# Patient Record
Sex: Male | Born: 1980 | Race: Black or African American | Hispanic: No | Marital: Married | Smoking: Never smoker
Health system: Southern US, Community
[De-identification: ages and names within clinical notes are randomized; demographics above are authoritative.]

## PROBLEM LIST (undated history)

## (undated) DIAGNOSIS — F4310 Post-traumatic stress disorder, unspecified: Secondary | ICD-10-CM

## (undated) DIAGNOSIS — E669 Obesity, unspecified: Secondary | ICD-10-CM

## (undated) DIAGNOSIS — Q613 Polycystic kidney, unspecified: Secondary | ICD-10-CM

## (undated) DIAGNOSIS — M109 Gout, unspecified: Secondary | ICD-10-CM

## (undated) DIAGNOSIS — I10 Essential (primary) hypertension: Secondary | ICD-10-CM

## (undated) HISTORY — PX: NO PAST SURGERIES: SHX2092

## (undated) HISTORY — PX: AV FISTULA PLACEMENT: SHX1204

## (undated) HISTORY — PX: KNEE SURGERY: SHX244

---

## 2004-08-14 ENCOUNTER — Emergency Department (HOSPITAL_COMMUNITY): Admission: EM | Admit: 2004-08-14 | Discharge: 2004-08-14 | Payer: Self-pay | Admitting: Emergency Medicine

## 2005-02-24 ENCOUNTER — Emergency Department (HOSPITAL_COMMUNITY): Admission: EM | Admit: 2005-02-24 | Discharge: 2005-02-24 | Payer: Self-pay | Admitting: Emergency Medicine

## 2010-07-12 ENCOUNTER — Emergency Department: Payer: Self-pay | Admitting: Emergency Medicine

## 2012-09-24 ENCOUNTER — Emergency Department: Payer: Self-pay | Admitting: Emergency Medicine

## 2013-12-03 ENCOUNTER — Emergency Department: Payer: Self-pay | Admitting: Emergency Medicine

## 2014-06-21 ENCOUNTER — Emergency Department: Payer: Self-pay | Admitting: Emergency Medicine

## 2017-01-23 ENCOUNTER — Encounter: Payer: Self-pay | Admitting: *Deleted

## 2017-01-23 ENCOUNTER — Emergency Department: Payer: Non-veteran care

## 2017-01-23 ENCOUNTER — Inpatient Hospital Stay
Admission: EM | Admit: 2017-01-23 | Discharge: 2017-01-24 | DRG: 305 | Disposition: A | Discharge status: Home / Self Care | Payer: Non-veteran care | Attending: Internal Medicine | Admitting: Internal Medicine

## 2017-01-23 DIAGNOSIS — N189 Chronic kidney disease, unspecified: Secondary | ICD-10-CM | POA: Diagnosis present

## 2017-01-23 DIAGNOSIS — M109 Gout, unspecified: Secondary | ICD-10-CM | POA: Diagnosis present

## 2017-01-23 DIAGNOSIS — I129 Hypertensive chronic kidney disease with stage 1 through stage 4 chronic kidney disease, or unspecified chronic kidney disease: Secondary | ICD-10-CM | POA: Diagnosis present

## 2017-01-23 DIAGNOSIS — R0600 Dyspnea, unspecified: Secondary | ICD-10-CM

## 2017-01-23 DIAGNOSIS — E1122 Type 2 diabetes mellitus with diabetic chronic kidney disease: Secondary | ICD-10-CM | POA: Diagnosis present

## 2017-01-23 DIAGNOSIS — R042 Hemoptysis: Secondary | ICD-10-CM

## 2017-01-23 DIAGNOSIS — N179 Acute kidney failure, unspecified: Secondary | ICD-10-CM | POA: Diagnosis present

## 2017-01-23 DIAGNOSIS — R0602 Shortness of breath: Secondary | ICD-10-CM | POA: Diagnosis present

## 2017-01-23 DIAGNOSIS — I10 Essential (primary) hypertension: Secondary | ICD-10-CM | POA: Diagnosis present

## 2017-01-23 DIAGNOSIS — Z6841 Body Mass Index (BMI) 40.0 and over, adult: Secondary | ICD-10-CM

## 2017-01-23 DIAGNOSIS — I160 Hypertensive urgency: Secondary | ICD-10-CM | POA: Diagnosis not present

## 2017-01-23 DIAGNOSIS — Z79899 Other long term (current) drug therapy: Secondary | ICD-10-CM

## 2017-01-23 HISTORY — DX: Essential (primary) hypertension: I10

## 2017-01-23 HISTORY — DX: Gout, unspecified: M10.9

## 2017-01-23 HISTORY — DX: Obesity, unspecified: E66.9

## 2017-01-23 LAB — BASIC METABOLIC PANEL
Anion gap: 7 (ref 5–15)
BUN: 22 mg/dL — AB (ref 6–20)
CALCIUM: 9.4 mg/dL (ref 8.9–10.3)
CHLORIDE: 108 mmol/L (ref 101–111)
CO2: 26 mmol/L (ref 22–32)
CREATININE: 2.2 mg/dL — AB (ref 0.61–1.24)
GFR calc Af Amer: 43 mL/min — ABNORMAL LOW (ref >60)
GFR calc non Af Amer: 37 mL/min — ABNORMAL LOW (ref >60)
GLUCOSE: 100 mg/dL — AB (ref 65–99)
Potassium: 4.2 mmol/L (ref 3.5–5.1)
Sodium: 141 mmol/L (ref 135–145)

## 2017-01-23 LAB — CBC
HCT: 40.6 % (ref 40.0–52.0)
Hemoglobin: 13.6 g/dL (ref 13.0–18.0)
MCH: 27.6 pg (ref 26.0–34.0)
MCHC: 33.5 g/dL (ref 32.0–36.0)
MCV: 82.4 fL (ref 80.0–100.0)
PLATELETS: 191 10*3/uL (ref 150–440)
RBC: 4.93 MIL/uL (ref 4.40–5.90)
RDW: 13.7 % (ref 11.5–14.5)
WBC: 6.7 10*3/uL (ref 3.8–10.6)

## 2017-01-23 LAB — TROPONIN I

## 2017-01-23 LAB — BRAIN NATRIURETIC PEPTIDE: B Natriuretic Peptide: 20 pg/mL (ref 0.0–100.0)

## 2017-01-23 LAB — FIBRIN DERIVATIVES D-DIMER (ARMC ONLY): Fibrin derivatives D-dimer (ARMC): 329.25 (ref 0.00–499.00)

## 2017-01-23 MED ORDER — HYDRALAZINE HCL 20 MG/ML IJ SOLN
INTRAMUSCULAR | Status: AC
  Administered 2017-01-23: 10 mg via INTRAVENOUS
  Filled 2017-01-23: qty 1

## 2017-01-23 MED ORDER — HYDRALAZINE HCL 20 MG/ML IJ SOLN
10.0000 mg | INTRAMUSCULAR | Status: DC | PRN
Start: 2017-01-23 — End: 2017-01-24
  Administered 2017-01-23: 10 mg via INTRAVENOUS

## 2017-01-23 MED ORDER — HYDRALAZINE HCL 50 MG PO TABS
25.0000 mg | ORAL_TABLET | ORAL | Status: AC
  Administered 2017-01-23: 25 mg via ORAL
  Filled 2017-01-23: qty 1

## 2017-01-23 MED ORDER — FUROSEMIDE 40 MG PO TABS
40.0000 mg | ORAL_TABLET | ORAL | Status: AC
  Administered 2017-01-23: 40 mg via ORAL
  Filled 2017-01-23: qty 1

## 2017-01-23 MED ORDER — FUROSEMIDE 40 MG PO TABS: 40.0000 mg | ORAL_TABLET | Freq: Once | ORAL | Status: DC

## 2017-01-23 MED ORDER — HYDRALAZINE HCL 20 MG/ML IJ SOLN: 10.0000 mg | INTRAMUSCULAR | Status: DC | PRN

## 2017-01-23 MED ORDER — AMLODIPINE BESYLATE 10 MG PO TABS
10.0000 mg | ORAL_TABLET | Freq: Every day | ORAL | Status: DC
  Administered 2017-01-23 – 2017-01-24 (×2): 10 mg via ORAL
  Filled 2017-01-23: qty 1
  Filled 2017-01-23: qty 2

## 2017-01-23 NOTE — ED Notes (Signed)
[facility] [place]

## 2017-01-23 NOTE — ED Notes (Signed)
[facility] called to say that they only had one medical bed left and would not be able to accept the patient at this time. The earliest estimate would be Thursday before a bed opened up. MD advised of same.

## 2017-01-23 NOTE — ED Notes (Signed)
Patient denies pain and is resting comfortably.  

## 2017-01-23 NOTE — ED Triage Notes (Signed)
Pt reports a cough and sob. states coughing up blood tinged sputum today.  No chest pain  Pt alert.

## 2017-01-23 NOTE — ED Notes (Signed)
Patient ambulatory to the bathroom. Family remains at bedside. Tolerating po blood pressure medicine. Denies need for anything at this time. Will continue to monitor.

## 2017-01-23 NOTE — Progress Notes (Deleted)
Family Meeting Note  Advance Directiveyes  Today a meeting took place with the yes   The following clinical team members were present during this meeting:pt and dter The following were discussed:Patient's diagnosis: , Patient's progosis: Patient is being admitted for evaluation of microcytic anemia with heme-positive stools or in the lumen was 6.6 she is going to be transfused 1 unit of blood transfusion.  She is not actively bleeding at present.  Hemodynamically otherwise stable at present.   CODE STATUS addressed. Patient is a full code.  Time spent during discussion Hilmar-Irwin, MD

## 2017-01-23 NOTE — ED Notes (Signed)
Meal tray given. Patient's family at bedside. Dr Jannifer Franklin in to evaluate patient for admission.

## 2017-01-23 NOTE — H&P (Signed)
[place] [facility] [place] NAME: Marc Neal    MR#:  [id number]  DATE OF BIRTH:  29-Aug-1980  DATE OF ADMISSION:  01/23/2017  PRIMARY CARE PHYSICIAN: [facility]   REQUESTING/REFERRING PHYSICIAN: Jacqualine Code, MD  CHIEF COMPLAINT:   Chief Complaint  Patient presents with  . Cough  . Shortness of Breath    HISTORY OF PRESENT ILLNESS:  Marc Neal  is a 36 y.o. male who presents with wheezing and shortness of breath. Patient states this occurred after he had engaged in some strenuous exercise. Patient has a history of hypertension, states that his normal blood pressure runs between 824-235 systolic, and 36-144 diastolic. Patient states he's been taking his lisinopril every day, but ran out of his amlodipine several days ago. This medication is has been ordered, but has not arrived yet. Tonight his blood pressure was 315 systolic over 400Q and 676P diastolic. His chest x-rays consistent with vascular congestion. He does have a history of some chronic kidney disease, but states that his creatinine is usually less than 2. It was 2.2 in the ED here today, indicating some acute on chronic renal failure. Hospitalists were called for admission.  PAST MEDICAL HISTORY:   Past Medical History:  Diagnosis Date  . Gout   . HTN (hypertension)   . Obesity     PAST SURGICAL HISTORY:   Past Surgical History:  Procedure Laterality Date  . NO PAST SURGERIES      SOCIAL HISTORY:   Social History  Substance Use Topics  . Smoking status: Never Smoker  . Smokeless tobacco: Never Used  . Alcohol use Yes    FAMILY HISTORY:   Family History  Problem Relation Age of Onset  . Obesity Other     DRUG ALLERGIES:  No Known Allergies  MEDICATIONS AT HOME:   Prior to Admission medications   Medication Sig Start Date End Date Taking? Authorizing Provider  allopurinol (ZYLOPRIM) 100 MG tablet Take 100 mg by mouth daily.   Yes [provider]  amLODipine (NORVASC) 5 MG tablet Take 5 mg by mouth daily.   Yes [provider]  cholecalciferol (VITAMIN D) 1000 units tablet Take 1,000 Units by mouth daily.   Yes [provider]  lisinopril (PRINIVIL,ZESTRIL) 10 MG tablet Take 10 mg by mouth daily.   Yes [provider]    REVIEW OF SYSTEMS:  Review of Systems  Constitutional: Negative for chills, fever, malaise/fatigue and weight loss.  HENT: Negative for ear pain, hearing loss and tinnitus.   Eyes: Negative for blurred vision, double vision, pain and redness.  Respiratory: Positive for cough, shortness of breath and wheezing. Negative for hemoptysis.   Cardiovascular: Negative for chest pain, palpitations, orthopnea and leg swelling.  Gastrointestinal: Negative for abdominal pain, constipation, diarrhea, nausea and vomiting.  Genitourinary: Negative for dysuria, frequency and hematuria.  Musculoskeletal: Negative for back pain, joint pain and neck pain.  Skin:       No acne, rash, or lesions  Neurological: Negative for dizziness, tremors, focal weakness and weakness.  Endo/Heme/Allergies: Negative for polydipsia. Does not bruise/bleed easily.  Psychiatric/Behavioral: Negative for depression. The patient is not nervous/anxious and does not have insomnia.      VITAL SIGNS:   Vitals:   01/23/17 1708 01/23/17 1712 01/23/17 1900  BP:  (!) 158/120 (!) 163/134  Pulse:  100 72  Resp:  (!) 24 (!) 26  Temp:  99.2 F (37.3 C)   TempSrc:  Oral   SpO2:  91% 96%  Weight: (!) 154.2 kg (340 lb)    Height: 6\' 3"  (1.905 m)     Wt Readings from Last 3 Encounters:  01/23/17 (!) 154.2 kg (340 lb)    PHYSICAL EXAMINATION:  Physical Exam  Vitals reviewed. Constitutional: He is oriented to person, place, and time. He appears well-developed and well-nourished. No distress.  HENT:  Head: Normocephalic and atraumatic.  Mouth/Throat: Oropharynx is clear and moist.  Eyes: Conjunctivae and EOM are  normal. Pupils are equal, round, and reactive to light. No scleral icterus.  Neck: Normal range of motion. Neck supple. No JVD present. No thyromegaly present.  Cardiovascular: Normal rate, regular rhythm and intact distal pulses.  Exam reveals no gallop and no friction rub.   No murmur heard. Respiratory: Effort normal and breath sounds normal. No respiratory distress. He has no wheezes. He has no rales.  GI: Soft. Bowel sounds are normal. He exhibits no distension. There is no tenderness.  Musculoskeletal: Normal range of motion. He exhibits no edema.  No arthritis, no gout  Lymphadenopathy:    He has no cervical adenopathy.  Neurological: He is alert and oriented to person, place, and time. No cranial nerve deficit.  No dysarthria, no aphasia  Skin: Skin is warm and dry. No rash noted. No erythema.  Psychiatric: He has a normal mood and affect. His behavior is normal. Judgment and thought content normal.    LABORATORY PANEL:   CBC  Recent Labs Lab 01/23/17 1715  WBC 6.7  HGB 13.6  HCT 40.6  PLT 191   ------------------------------------------------------------------------------------------------------------------  Chemistries   Recent Labs Lab 01/23/17 1715  NA 141  K 4.2  CL 108  CO2 26  GLUCOSE 100*  BUN 22*  CREATININE 2.20*  CALCIUM 9.4   ------------------------------------------------------------------------------------------------------------------  Cardiac Enzymes  Recent Labs Lab 01/23/17 1715  TROPONINI <0.03   ------------------------------------------------------------------------------------------------------------------  RADIOLOGY:  Dg Chest 2 View  Result Date: 01/23/2017 CLINICAL DATA:  Cough, shortness of Breath EXAM: CHEST  2 VIEW COMPARISON:  None. FINDINGS: Cardiomegaly. Mild interstitial prominence within the lungs. No effusions or acute bony abnormality. IMPRESSION: Mild interstitial prominence throughout the lungs which could reflect  atypical infection or edema. Electronically Signed   By: Rolm Baptise M.D.   On: 01/23/2017 17:37    EKG:   Orders placed or performed during the hospital encounter of 01/23/17  . ED EKG within 10 minutes  . ED EKG within 10 minutes    IMPRESSION AND PLAN:  Principal Problem:   Accelerated hypertension - patient was given Lasix in the ED, as well as when necessary hydralazine. Given his renal failure we'll not give him any further Lasix at this point. When necessary hydralazine and has not reduced his blood pressure very much. We will use IV hydralazine as needed upfront, we will also give him his home medication of amlodipine. Patient will likely need adjustment of his home medication doses for better blood pressure control. We will get an echocardiogram in the morning, and a cardiology consult. Active Problems:   AKI (acute kidney injury) ([place]) - patient's creatinine is mildly up from his baseline, but not drastically so. We will monitor for improvement after correction of his blood pressure. We will avoid IV fluids at this time given the vascular congestion he had on his chest x-ray which required a dose of Lasix.   SOB (shortness of breath) - due to his accelerated hypertension and likely what was the beginnings of some  flash pulmonary edema, though he never quite made to evaluate the edema phase, and just had vascular congestion on chest x-ray.   Gout - continue home meds  All the records are reviewed and case discussed with ED provider. Management plans discussed with the patient and/or family.  DVT PROPHYLAXIS: SubQ heparin  GI PROPHYLAXIS: None  ADMISSION STATUS: Inpatient  CODE STATUS: Full Code Status History    This patient does not have a recorded code status. Please follow your organizational policy for patients in this situation.      TOTAL TIME TAKING CARE OF THIS PATIENT: 45 minutes.   Taralynn Quiett Larimore 01/23/2017, 9:08 PM  [facility]  Office   [phone]  CC: Primary care physician; [facility]  Note:  This document was prepared using Dragon voice recognition software and may include unintentional dictation errors.

## 2017-01-23 NOTE — ED Notes (Signed)
Patient denies need for anything at this time. Wife at bedside given blanket.

## 2017-01-23 NOTE — ED Provider Notes (Signed)
[facility] Emergency Department Provider Note   ____________________________________________   First MD Initiated Contact with Patient 01/23/17 1740     (approximate)  I have reviewed the triage vital signs and the nursing notes.   HISTORY  Chief Complaint Cough and Shortness of Breath    HPI Marc Neal is a 36 y.o. male previous history of gout, hypertension and polycystic kidney disease  Patient reports yesterday he didn't feel well, slightly fatigued, today he is developed slowly worsening cough with a small amount of blood in the sputum at times, feeling short of breath. Mostly with exerting himself. Denies any chest pain at all.  No fever or chill he is aware of, was told he may have a "low-grade" temperature when he got triaged here.  No history of long trips or travel. No previous blood clots. Does not take any estrogen. He is a nonsmoker. He is not noticing leg swelling.   Past Medical History:  Diagnosis Date  . Gout   . HTN (hypertension)   . Obesity     Patient Active Problem List   Diagnosis Date Noted  . Accelerated hypertension 01/23/2017  . AKI (acute kidney injury) ([place]) 01/23/2017  . Gout 01/23/2017  . SOB (shortness of breath) 01/23/2017    Past Surgical History:  Procedure Laterality Date  . NO PAST SURGERIES      Prior to Admission medications   Medication Sig Start Date End Date Taking? Authorizing Provider  allopurinol (ZYLOPRIM) 100 MG tablet Take 100 mg by mouth daily.   Yes [provider]  amLODipine (NORVASC) 5 MG tablet Take 5 mg by mouth daily.   Yes [provider]  cholecalciferol (VITAMIN D) 1000 units tablet Take 1,000 Units by mouth daily.   Yes [provider]  lisinopril (PRINIVIL,ZESTRIL) 10 MG tablet Take 10 mg by mouth daily.   Yes [provider]  Takes a couple antihypertensives and allopurinol  Allergies Patient has no known allergies.  Family  History  Problem Relation Age of Onset  . Obesity Other     Social History Social History  Substance Use Topics  . Smoking status: Never Smoker  . Smokeless tobacco: Never Used  . Alcohol use Yes    Review of Systems Constitutional: No fever/chills Eyes: No visual changes. ENT: No sore throat. Cardiovascular: Denies chest pain. Respiratory: See history of present illness  Gastrointestinal: No abdominal pain.  No nausea, no vomiting.  No diarrhea.  No constipation. Genitourinary: Negative for dysuria. Musculoskeletal: Negative for back pain. Skin: Negative for rash. Neurological: Negative for headaches, focal weakness or numbness.    ____________________________________________   PHYSICAL EXAM:  VITAL SIGNS: ED Triage Vitals  Enc Vitals Group     BP 01/23/17 1712 (!) 158/120     Pulse Rate 01/23/17 1712 100     Resp 01/23/17 1712 (!) 24     Temp 01/23/17 1712 99.2 F (37.3 C)     Temp Source 01/23/17 1712 Oral     SpO2 01/23/17 1712 91 %     Weight 01/23/17 1708 (!) 340 lb (154.2 kg)     Height 01/23/17 1708 6\' 3"  (1.905 m)     Head Circumference --      Peak Flow --      Pain Score 01/23/17 1708 4     Pain Loc --      Pain Edu? --      Excl. in [place]? --     Constitutional: Alert  and oriented. Well appearing and in no acute distress. Eyes: Conjunctivae are normal. Head: Atraumatic. Nose: No congestion/rhinnorhea. Mouth/Throat: Mucous membranes are moist. Neck: No stridor.   Cardiovascular: Normal rate, regular rhythm. Grossly normal heart sounds.  Good peripheral circulation. Respiratory: Normal respiratory effort.  No retractions. Lungs CTAB In the upper lobes but demonstrates mild rails in the lower lobes bilaterally. Gastrointestinal: Soft and nontender. No distention. Musculoskeletal: No lower extremity tenderness nor edema. Neurologic:  Normal speech and language. No gross focal neurologic deficits are appreciated.  Skin:  Skin is warm, dry and intact.  No rash noted. Psychiatric: Mood and affect are normal. Speech and behavior are normal.  ____________________________________________   LABS (all labs ordered are listed, but only abnormal results are displayed)  Labs Reviewed  BASIC METABOLIC PANEL - Abnormal; Notable for the following:       Result Value   Glucose, Bld 100 (*)    BUN 22 (*)    Creatinine, Ser 2.20 (*)    GFR calc non Af Amer 37 (*)    GFR calc Af Amer 43 (*)    All other components within normal limits  CBC  TROPONIN I  BRAIN NATRIURETIC PEPTIDE  FIBRIN DERIVATIVES D-DIMER ([facility] ONLY)   ____________________________________________  EKG  Reviewed and interpreted by me at 1720 Ventricular rate 99 QRS 80 QTc 480 Normal sinus rhythm, prolonged QT, no evidence of acute ischemia ____________________________________________  RADIOLOGY  Dg Chest 2 View  Result Date: 01/23/2017 CLINICAL DATA:  Cough, shortness of Breath EXAM: CHEST  2 VIEW COMPARISON:  None. FINDINGS: Cardiomegaly. Mild interstitial prominence within the lungs. No effusions or acute bony abnormality. IMPRESSION: Mild interstitial prominence throughout the lungs which could reflect atypical infection or edema. Electronically Signed   By: Rolm Baptise M.D.   On: 01/23/2017 17:37    ____________________________________________   PROCEDURES  Procedure(s) performed: None  Procedures  Critical Care performed: No  ____________________________________________   INITIAL IMPRESSION / ASSESSMENT AND PLAN / ED COURSE  Pertinent labs & imaging results that were available during my care of the patient were reviewed by me and considered in my medical decision making (see chart for details).  Patient presents for slight fatigue now worsening shortness of breath with a small amount of hemoptysis noted with sputum.  He is minimally tachycardic, moderately hypertensive with significant diastolic hypertension. His symptoms sound first somewhat like  someone with bronchitis, but on further evaluation and reviewing his BUN and creatinine and history of polycystic kidneys along with his chest x-ray findings I question if this could represent early CHF which would be a new diagnosis for him, or possibly a hypertensive urgency.  He demonstrates bilateral lower lobe rales, his white count is normal, and he has no registered fever though he did have a 99.2 which would be very low-grade afebrile though. I am concerned that he may be developing CHF needs further workup including a probably a cardiac echocardiogram.  His oxygen saturation of 91% as slightly low, and he is slightly tachypnea.  [facility] with [facility] at 618PM, reviewed case and care.   Clinical Course as of Jan 23 2330  Tue Jan 23, 2017  1901 [facility] completed and faxed  [MQ]    Clinical Course User Index [MQ] Delman Kitten, MD   ----------------------------------------- 8:06 PM on 01/23/2017 -----------------------------------------  Patient diuresis and now after Lasix. Her his oxygen saturation improving slightly. The [facility] [place] [facility] has reported to [place] that they have  no bed availability for the patient until Thursday. Last, patient will be admitted to our facility based on no availability at the [facility].  Patient agreeable with plan. Anticipate admission, workup for renal insufficiency, obtain records from the [place] which have been requested, and likely need for an echo to further evaluate as the patient may have pulmonary edema causing his presentation today associated with his severe diastolic hypertension.  ____________________________________________   FINAL CLINICAL IMPRESSION(S) / ED DIAGNOSES  Final diagnoses:  Hypertensive urgency  Dyspnea, unspecified type  Hemoptysis      NEW MEDICATIONS STARTED DURING THIS VISIT:  New Prescriptions   No medications on file     Note:  This document was prepared using Dragon  voice recognition software and may include unintentional dictation errors.     Delman Kitten, MD 01/23/17 [phone]

## 2017-01-24 ENCOUNTER — Inpatient Hospital Stay
Admit: 2017-01-24 | Discharge: 2017-01-24 | Disposition: A | Discharge status: Home / Self Care | Payer: Non-veteran care | Attending: Internal Medicine | Admitting: Internal Medicine

## 2017-01-24 LAB — CBC
HEMATOCRIT: 40.6 % (ref 40.0–52.0)
HEMOGLOBIN: 13.6 g/dL (ref 13.0–18.0)
MCH: 28 pg (ref 26.0–34.0)
MCHC: 33.6 g/dL (ref 32.0–36.0)
MCV: 83.3 fL (ref 80.0–100.0)
Platelets: 163 10*3/uL (ref 150–440)
RBC: 4.87 MIL/uL (ref 4.40–5.90)
RDW: 13.5 % (ref 11.5–14.5)
WBC: 6 10*3/uL (ref 3.8–10.6)

## 2017-01-24 LAB — BASIC METABOLIC PANEL
ANION GAP: 6 (ref 5–15)
BUN: 21 mg/dL — ABNORMAL HIGH (ref 6–20)
CALCIUM: 9.3 mg/dL (ref 8.9–10.3)
CO2: 29 mmol/L (ref 22–32)
Chloride: 106 mmol/L (ref 101–111)
Creatinine, Ser: 1.9 mg/dL — ABNORMAL HIGH (ref 0.61–1.24)
GFR, EST AFRICAN AMERICAN: 51 mL/min — AB (ref >60)
GFR, EST NON AFRICAN AMERICAN: 44 mL/min — AB (ref >60)
GLUCOSE: 108 mg/dL — AB (ref 65–99)
POTASSIUM: 3.6 mmol/L (ref 3.5–5.1)
SODIUM: 141 mmol/L (ref 135–145)

## 2017-01-24 LAB — ECHOCARDIOGRAM COMPLETE
HEIGHTINCHES: 75 in
WEIGHTICAEL: 5391.99 [oz_av]

## 2017-01-24 LAB — HIV ANTIBODY (ROUTINE TESTING W REFLEX): HIV SCREEN 4TH GENERATION: NONREACTIVE

## 2017-01-24 MED ORDER — LABETALOL HCL 300 MG PO TABS: 300.0000 mg | ORAL_TABLET | Freq: Two times a day (BID) | ORAL | 0 refills | Status: DC

## 2017-01-24 MED ORDER — ACETAMINOPHEN 650 MG RE SUPP: 650.0000 mg | Freq: Four times a day (QID) | RECTAL | Status: DC | PRN

## 2017-01-24 MED ORDER — LISINOPRIL 20 MG PO TABS
40.0000 mg | ORAL_TABLET | Freq: Every day | ORAL | Status: DC
  Administered 2017-01-24: 40 mg via ORAL
  Filled 2017-01-24: qty 2

## 2017-01-24 MED ORDER — HEPARIN SODIUM (PORCINE) 5000 UNIT/ML IJ SOLN: 5000.0000 [IU] | Freq: Three times a day (TID) | INTRAMUSCULAR | Status: DC

## 2017-01-24 MED ORDER — ALLOPURINOL 100 MG PO TABS
100.0000 mg | ORAL_TABLET | Freq: Every day | ORAL | Status: DC
  Administered 2017-01-24: 100 mg via ORAL
  Filled 2017-01-24: qty 1

## 2017-01-24 MED ORDER — ONDANSETRON HCL 4 MG PO TABS: 4.0000 mg | ORAL_TABLET | Freq: Four times a day (QID) | ORAL | Status: DC | PRN

## 2017-01-24 MED ORDER — LISINOPRIL 10 MG PO TABS
10.0000 mg | ORAL_TABLET | Freq: Every day | ORAL | Status: DC
  Filled 2017-01-24: qty 1

## 2017-01-24 MED ORDER — LABETALOL HCL 100 MG PO TABS
300.0000 mg | ORAL_TABLET | Freq: Two times a day (BID) | ORAL | Status: DC
  Administered 2017-01-24: 300 mg via ORAL
  Filled 2017-01-24: qty 1
  Filled 2017-01-24: qty 3

## 2017-01-24 MED ORDER — LABETALOL HCL 5 MG/ML IV SOLN: 10.0000 mg | INTRAVENOUS | Status: DC | PRN

## 2017-01-24 MED ORDER — LISINOPRIL 40 MG PO TABS: 40.0000 mg | ORAL_TABLET | Freq: Every day | ORAL | 0 refills | Status: DC

## 2017-01-24 MED ORDER — ONDANSETRON HCL 4 MG/2ML IJ SOLN: 4.0000 mg | Freq: Four times a day (QID) | INTRAMUSCULAR | Status: DC | PRN

## 2017-01-24 MED ORDER — ACETAMINOPHEN 325 MG PO TABS
650.0000 mg | ORAL_TABLET | Freq: Four times a day (QID) | ORAL | Status: DC | PRN
  Administered 2017-01-24: 650 mg via ORAL
  Filled 2017-01-24: qty 2

## 2017-01-24 MED ORDER — AMLODIPINE BESYLATE 10 MG PO TABS: 10.0000 mg | ORAL_TABLET | Freq: Every day | ORAL | 0 refills | Status: DC

## 2017-01-24 NOTE — Discharge Instructions (Signed)
[place] at [place]:  Cardiac diet  DISCHARGE CONDITION:  Stable  ACTIVITY:  Activity as tolerated  OXYGEN:  Home Oxygen: No.   Oxygen Delivery: room air  DISCHARGE LOCATION:  home    ADDITIONAL DISCHARGE INSTRUCTION: take all your meds   If you experience worsening of your admission symptoms, develop shortness of breath, life threatening emergency, suicidal or homicidal thoughts you must seek medical attention immediately by calling 911 or calling your MD immediately  if symptoms less severe.  You Must read complete instructions/literature along with all the possible adverse reactions/side effects for all the Medicines you take and that have been prescribed to you. Take any new Medicines after you have completely understood and accpet all the possible adverse reactions/side effects.   Please note  You were cared for by a hospitalist during your hospital stay. If you have any questions about your discharge medications or the care you received while you were in the hospital after you are discharged, you can call the unit and asked to speak with the hospitalist on call if the hospitalist that took care of you is not available. Once you are discharged, your primary care physician will handle any further medical issues. Please note that NO REFILLS for any discharge medications will be authorized once you are discharged, as it is imperative that you return to your primary care physician (or establish a relationship with a primary care physician if you do not have one) for your aftercare needs so that they can reassess your need for medications and monitor your lab values.

## 2017-01-24 NOTE — Progress Notes (Signed)
Patient alert and oriented, vss, no complaints of pain.  Educated on new medications and following the DASH diet.  No questions at this time.  D/C telemetry and PIV.  Escorted out of hospital via wheelchair by volunteers.

## 2017-01-24 NOTE — Discharge Summary (Signed)
[place] at [facility], [place] y.o., DOB Feb 08, 1981, MRN [id number]. Admission date: 01/23/2017 Discharge Date 01/24/2017 Primary MD [facility] Admitting Physician Lance Coon, MD  Admission Diagnosis  Hypertensive urgency [I16.0] Hemoptysis [R04.2] Dyspnea, unspecified type [R06.00]  Discharge Diagnosis   Principal Problem:   Accelerated hypertension    AKI (acute kidney injury) ([place])   Gout   SOB (shortness of breath)  Morbid obesity  Chronic kidney disease stage unknown        Hospital Course patient is a 36 year old male with history of hypertension presented with wheezing and shortness of breath. Patient's blood pressure was very elevated in the emergency room. He was admitted for further evaluation. Patient also was noted to have elevated creatinine. Due to this she was admitted for further evaluation. Patient's blood pressure medications have been adjusted his blood pressures improved. He had a echo cardiac, the heart which showed LVH otherwise no significant abnormality. Patient's renal function is close to baseline. He'll need outpatient close monitoring of his blood pressure and renal function.            Consults  cardiology  Significant Tests:  See full reports for all details    Dg Chest 2 View  Result Date: 01/23/2017 CLINICAL DATA:  Cough, shortness of Breath EXAM: CHEST  2 VIEW COMPARISON:  None. FINDINGS: Cardiomegaly. Mild interstitial prominence within the lungs. No effusions or acute bony abnormality. IMPRESSION: Mild interstitial prominence throughout the lungs which could reflect atypical infection or edema. Electronically Signed   By: Rolm Baptise M.D.   On: 01/23/2017 17:37       Today   Subjective:   Marc Neal patient feels better denies any chest pain or shortness of breath  Objective:   Blood pressure (!) 170/105, pulse 80, temperature 98.2 F (36.8 C), temperature source Oral, resp.  rate 18, height 6\' 3"  (1.905 m), weight (!) 337 lb (152.9 kg), SpO2 92 %.  .  Intake/Output Summary (Last 24 hours) at 01/24/17 1319 Last data filed at 01/24/17 0845  Gross per 24 hour  Intake              240 ml  Output             2775 ml  Net            -2535 ml    Exam VITAL SIGNS: Blood pressure (!) 170/105, pulse 80, temperature 98.2 F (36.8 C), temperature source Oral, resp. rate 18, height 6\' 3"  (1.905 m), weight (!) 337 lb (152.9 kg), SpO2 92 %.  GENERAL:  36 y.o.-year-old patient lying in the bed with no acute distress.  EYES: Pupils equal, round, reactive to light and accommodation. No scleral icterus. Extraocular muscles intact.  HEENT: Head atraumatic, normocephalic. Oropharynx and nasopharynx clear.  NECK:  Supple, no jugular venous distention. No thyroid enlargement, no tenderness.  LUNGS: Normal breath sounds bilaterally, no wheezing, rales,rhonchi or crepitation. No use of accessory muscles of respiration.  CARDIOVASCULAR: S1, S2 normal. No murmurs, rubs, or gallops.  ABDOMEN: Soft, nontender, nondistended. Bowel sounds present. No organomegaly or mass.  EXTREMITIES: No pedal edema, cyanosis, or clubbing.  NEUROLOGIC: Cranial nerves II through XII are intact. Muscle strength 5/5 in all extremities. Sensation intact. Gait not checked.  PSYCHIATRIC: The patient is alert and oriented x 3.  SKIN: No obvious rash, lesion, or ulcer.   Data Review     CBC w Diff: Lab Results  Component Value  Date   WBC 6.0 01/24/2017   HGB 13.6 01/24/2017   HCT 40.6 01/24/2017   PLT 163 01/24/2017   CMP: Lab Results  Component Value Date   NA 141 01/24/2017   K 3.6 01/24/2017   CL 106 01/24/2017   CO2 29 01/24/2017   BUN 21 (H) 01/24/2017   CREATININE 1.90 (H) 01/24/2017  .  Micro Results No results found for this or any previous visit (from the past 240 hour(s)).      Code Status Orders        Start     Ordered   01/24/17 0034  Full code  Continuous     01/24/17  0033    Code Status History    Date Active Date Inactive Code Status Order ID Comments User Context   This patient has a current code status but no historical code status.          Follow-up Information    [facility] Follow up in 1 week(s).   Contact information: [place] [address] [phone]           Discharge Medications   Allergies as of 01/24/2017   No Known Allergies     Medication List    TAKE these medications   allopurinol 100 MG tablet Commonly known as:  ZYLOPRIM Take 100 mg by mouth daily.   amLODipine 10 MG tablet Commonly known as:  NORVASC Take 1 tablet (10 mg total) by mouth daily. Start taking on:  01/25/2017 What changed:  medication strength  how much to take   cholecalciferol 1000 units tablet Commonly known as:  VITAMIN D Take 1,000 Units by mouth daily.   labetalol 300 MG tablet Commonly known as:  NORMODYNE Take 1 tablet (300 mg total) by mouth 2 (two) times daily.   lisinopril 40 MG tablet Commonly known as:  PRINIVIL,ZESTRIL Take 1 tablet (40 mg total) by mouth daily. Start taking on:  01/25/2017 What changed:  medication strength  how much to take          Total Time in preparing paper work, data evaluation and todays exam - 35 minutes  Dustin Flock M.D on 01/24/2017 at 1:19 PM  [facility]   Office  [phone]

## 2017-01-24 NOTE — Care Management (Signed)
Patient wishes to remain at [facility] for treatment. He states he feels ready to return home. He states that he is independent with daily activity. Lives with wife that is at bedside. PCP is with [facility] [phone] or [phone] (fax not on file). Refusal to transfer to [facility] faxed to [facility]- both facilities are closed for the holiday.

## 2017-01-24 NOTE — Progress Notes (Signed)
   01/24/17 1408  Clinical Encounter Type  Visited With Other (Comment)  Visit Type Initial   CH was rounding and approached patients room. No contact with patient as patient was with staff.

## 2017-01-24 NOTE — Progress Notes (Signed)
BP elevated, MD aware. Adjusting medications.  Continue to monitor.

## 2017-01-24 NOTE — Progress Notes (Signed)
BP under better control.  Patient ambulated around nursing station.  No complaints of shortness of breath or pain.  BP after exertion slightly higher.

## 2017-01-24 NOTE — Care Management (Signed)
Discharge summary faxed to [facility]. No other RNCM needs.

## 2017-01-24 NOTE — Consult Note (Signed)
[facility]  CARDIOLOGY CONSULT NOTE  Patient ID: Marc Neal MRN: [id number] DOB/AGE: [birth year] 36 y.o.  Admit date: 01/23/2017 Referring Physician Jannifer Franklin Primary Physician [facility] Primary Cardiologist  Reason for Consultation Hypertensive emergency  HPI: 36 year old gentleman referred for evaluation of hypertensive emergency. The patient has known history of essential hypertension, currently on lisinopril and amlodipine. The patient apparently ran out of amlodipine one to 2 weeks ago. He reports experiencing shortness of breath with wheezing. He presents to [facility] emergency room with hypertensive emergency with a pressure 158/120. Patient treated with intravenous labetalol. Admission ECG was nondiagnostic. Admission labs were notable for BUN and creatinine of 21 and 1.90, with troponin less than 0.03.  Review of systems complete and found to be negative unless listed above     Past Medical History:  Diagnosis Date  . Gout   . HTN (hypertension)   . Obesity     Past Surgical History:  Procedure Laterality Date  . NO PAST SURGERIES      Prescriptions Prior to Admission  Medication Sig Dispense Refill Last Dose  . allopurinol (ZYLOPRIM) 100 MG tablet Take 100 mg by mouth daily.   01/23/2017 at am  . amLODipine (NORVASC) 5 MG tablet Take 5 mg by mouth daily.   01/22/2017 at am  . cholecalciferol (VITAMIN D) 1000 units tablet Take 1,000 Units by mouth daily.   01/22/2017 at am  . lisinopril (PRINIVIL,ZESTRIL) 10 MG tablet Take 10 mg by mouth daily.   01/23/2017 at am   Social History   Social History  . Marital status: Married    Spouse name: N/A  . Number of children: N/A  . Years of education: N/A   Occupational History  . Not on file.   Social History Main Topics  . Smoking status: Never Smoker  . Smokeless tobacco: Never Used  . Alcohol use Yes  . Drug use: No  . Sexual activity: Not on file   Other Topics Concern  . Not on file   Social History Narrative  .  No narrative on file    Family History  Problem Relation Age of Onset  . Obesity Other       Review of systems complete and found to be negative unless listed above      PHYSICAL EXAM  General: Well developed, well nourished, in no acute distress HEENT:  Normocephalic and atramatic Neck:  No JVD.  Lungs: Clear bilaterally to auscultation and percussion. Heart: HRRR . Normal S1 and S2 without gallops or murmurs.  Abdomen: Bowel sounds are positive, abdomen soft and non-tender  Msk:  Back normal, normal gait. Normal strength and tone for age. Extremities: No clubbing, cyanosis or edema.   Neuro: Alert and oriented X 3. Psych:  Good affect, responds appropriately  Labs:   Lab Results  Component Value Date   WBC 6.0 01/24/2017   HGB 13.6 01/24/2017   HCT 40.6 01/24/2017   MCV 83.3 01/24/2017   PLT 163 01/24/2017    Recent Labs Lab 01/24/17 0448  NA 141  K 3.6  CL 106  CO2 29  BUN 21*  CREATININE 1.90*  CALCIUM 9.3  GLUCOSE 108*   Lab Results  Component Value Date   TROPONINI <0.03 01/23/2017   No results found for: CHOL No results found for: HDL No results found for: LDLCALC No results found for: TRIG No results found for: CHOLHDL No results found for: LDLDIRECT    Radiology: Dg Chest 2 View  Result Date:  01/23/2017 CLINICAL DATA:  Cough, shortness of Breath EXAM: CHEST  2 VIEW COMPARISON:  None. FINDINGS: Cardiomegaly. Mild interstitial prominence within the lungs. No effusions or acute bony abnormality. IMPRESSION: Mild interstitial prominence throughout the lungs which could reflect atypical infection or edema. Electronically Signed   By: Rolm Baptise M.D.   On: 01/23/2017 17:37    EKG: Normal sinus rhythm  ASSESSMENT AND PLAN:   1. Hypertensive emergency, likely due to medical and dietary noncompliance, patient clinically improved today, without chest pain, and negative troponin 2. Diabetic nephropathy  Recommendations  1. Agree with overall  current therapy 2. Start amlodipine 5-10 mg daily 3. Review 2-D echocardiogram 4. Strongly encouraged patient to exercise regularly 5. Strongly encouraged patient to lose weight 6. Stressed the importance of dietary and medical compliance  Sign off for now, please call if any questions  Signed: Isaias Cowman MD,PhD, [facility] 01/24/2017, 10:20 AM

## 2019-06-30 ENCOUNTER — Other Ambulatory Visit: Payer: Self-pay

## 2019-06-30 DIAGNOSIS — Z20822 Contact with and (suspected) exposure to covid-19: Secondary | ICD-10-CM

## 2019-07-01 LAB — NOVEL CORONAVIRUS, NAA: SARS-CoV-2, NAA: NOT DETECTED

## 2019-07-21 ENCOUNTER — Ambulatory Visit: Payer: No Typology Code available for payment source | Attending: Internal Medicine

## 2019-07-21 DIAGNOSIS — R238 Other skin changes: Secondary | ICD-10-CM

## 2019-07-21 DIAGNOSIS — U071 COVID-19: Secondary | ICD-10-CM

## 2019-07-23 LAB — NOVEL CORONAVIRUS, NAA: SARS-CoV-2, NAA: NOT DETECTED

## 2019-11-09 ENCOUNTER — Inpatient Hospital Stay
Admission: EM | Admit: 2019-11-09 | Discharge: 2019-11-11 | DRG: 392 | Disposition: A | Discharge status: Home / Self Care | Payer: No Typology Code available for payment source | Attending: Internal Medicine | Admitting: Internal Medicine

## 2019-11-09 ENCOUNTER — Emergency Department: Payer: No Typology Code available for payment source

## 2019-11-09 ENCOUNTER — Other Ambulatory Visit: Payer: Self-pay

## 2019-11-09 ENCOUNTER — Encounter: Payer: Self-pay | Admitting: Emergency Medicine

## 2019-11-09 ENCOUNTER — Emergency Department (HOSPITAL_COMMUNITY)
Admission: EM | Admit: 2019-11-09 | Discharge: 2019-11-09 | Disposition: A | Discharge status: Home / Self Care | Payer: No Typology Code available for payment source | Source: Home / Self Care | Attending: Emergency Medicine | Admitting: Emergency Medicine

## 2019-11-09 DIAGNOSIS — K529 Noninfective gastroenteritis and colitis, unspecified: Principal | ICD-10-CM | POA: Diagnosis present

## 2019-11-09 DIAGNOSIS — R109 Unspecified abdominal pain: Secondary | ICD-10-CM | POA: Diagnosis present

## 2019-11-09 DIAGNOSIS — R112 Nausea with vomiting, unspecified: Secondary | ICD-10-CM

## 2019-11-09 DIAGNOSIS — N183 Chronic kidney disease, stage 3 unspecified: Secondary | ICD-10-CM | POA: Diagnosis not present

## 2019-11-09 DIAGNOSIS — Z79899 Other long term (current) drug therapy: Secondary | ICD-10-CM | POA: Insufficient documentation

## 2019-11-09 DIAGNOSIS — N184 Chronic kidney disease, stage 4 (severe): Secondary | ICD-10-CM | POA: Diagnosis present

## 2019-11-09 DIAGNOSIS — I10 Essential (primary) hypertension: Secondary | ICD-10-CM | POA: Insufficient documentation

## 2019-11-09 DIAGNOSIS — I129 Hypertensive chronic kidney disease with stage 1 through stage 4 chronic kidney disease, or unspecified chronic kidney disease: Secondary | ICD-10-CM | POA: Diagnosis present

## 2019-11-09 DIAGNOSIS — Z20822 Contact with and (suspected) exposure to covid-19: Secondary | ICD-10-CM | POA: Diagnosis present

## 2019-11-09 DIAGNOSIS — M109 Gout, unspecified: Secondary | ICD-10-CM | POA: Diagnosis present

## 2019-11-09 DIAGNOSIS — Q613 Polycystic kidney, unspecified: Secondary | ICD-10-CM

## 2019-11-09 DIAGNOSIS — N179 Acute kidney failure, unspecified: Secondary | ICD-10-CM

## 2019-11-09 DIAGNOSIS — R1011 Right upper quadrant pain: Secondary | ICD-10-CM | POA: Diagnosis present

## 2019-11-09 DIAGNOSIS — R1031 Right lower quadrant pain: Secondary | ICD-10-CM | POA: Diagnosis not present

## 2019-11-09 LAB — URINALYSIS, COMPLETE (UACMP) WITH MICROSCOPIC
Bacteria, UA: NONE SEEN
Bilirubin Urine: NEGATIVE
Glucose, UA: NEGATIVE mg/dL
Ketones, ur: NEGATIVE mg/dL
Leukocytes,Ua: NEGATIVE
Nitrite: NEGATIVE
Protein, ur: 30 mg/dL — AB
Specific Gravity, Urine: 1.01 (ref 1.005–1.030)
Squamous Epithelial / LPF: NONE SEEN (ref 0–5)
pH: 6 (ref 5.0–8.0)

## 2019-11-09 LAB — LACTIC ACID, PLASMA: Lactic Acid, Venous: 0.9 mmol/L (ref 0.5–1.9)

## 2019-11-09 LAB — COMPREHENSIVE METABOLIC PANEL
ALT: 25 U/L (ref 0–44)
AST: 24 U/L (ref 15–41)
Albumin: 4.5 g/dL (ref 3.5–5.0)
Alkaline Phosphatase: 60 U/L (ref 38–126)
Anion gap: 10 (ref 5–15)
BUN: 37 mg/dL — ABNORMAL HIGH (ref 6–20)
CO2: 24 mmol/L (ref 22–32)
Calcium: 9.5 mg/dL (ref 8.9–10.3)
Chloride: 106 mmol/L (ref 98–111)
Creatinine, Ser: 3.63 mg/dL — ABNORMAL HIGH (ref 0.61–1.24)
GFR calc Af Amer: 23 mL/min — ABNORMAL LOW (ref >60)
GFR calc non Af Amer: 20 mL/min — ABNORMAL LOW (ref >60)
Glucose, Bld: 140 mg/dL — ABNORMAL HIGH (ref 70–99)
Potassium: 3.8 mmol/L (ref 3.5–5.1)
Sodium: 140 mmol/L (ref 135–145)
Total Bilirubin: 0.5 mg/dL (ref 0.3–1.2)
Total Protein: 8.2 g/dL — ABNORMAL HIGH (ref 6.5–8.1)

## 2019-11-09 LAB — CBC
HCT: 38.6 % — ABNORMAL LOW (ref 39.0–52.0)
Hemoglobin: 12.8 g/dL — ABNORMAL LOW (ref 13.0–17.0)
MCH: 27.6 pg (ref 26.0–34.0)
MCHC: 33.2 g/dL (ref 30.0–36.0)
MCV: 83.2 fL (ref 80.0–100.0)
Platelets: 214 10*3/uL (ref 150–400)
RBC: 4.64 MIL/uL (ref 4.22–5.81)
RDW: 12.9 % (ref 11.5–15.5)
WBC: 9 10*3/uL (ref 4.0–10.5)
nRBC: 0 % (ref 0.0–0.2)

## 2019-11-09 LAB — LIPASE, BLOOD: Lipase: 27 U/L (ref 11–51)

## 2019-11-09 MED ORDER — LABETALOL HCL 5 MG/ML IV SOLN: 10.0000 mg | INTRAVENOUS | Status: DC | PRN

## 2019-11-09 MED ORDER — OXYCODONE-ACETAMINOPHEN 5-325 MG PO TABS
1.0000 | ORAL_TABLET | Freq: Once | ORAL | Status: AC
  Administered 2019-11-09: 06:00:00 1 via ORAL
  Filled 2019-11-09: qty 1

## 2019-11-09 MED ORDER — AMLODIPINE BESYLATE 5 MG PO TABS
10.0000 mg | ORAL_TABLET | Freq: Once | ORAL | Status: AC
  Administered 2019-11-09: 10 mg via ORAL
  Filled 2019-11-09: qty 2

## 2019-11-09 MED ORDER — HYDROMORPHONE HCL 1 MG/ML IJ SOLN
1.0000 mg | INTRAMUSCULAR | Status: DC | PRN
  Administered 2019-11-10 (×2): 1 mg via INTRAVENOUS
  Filled 2019-11-09 (×2): qty 1

## 2019-11-09 MED ORDER — MORPHINE SULFATE (PF) 4 MG/ML IV SOLN
4.0000 mg | Freq: Once | INTRAVENOUS | Status: AC
  Administered 2019-11-09: 21:00:00 4 mg via INTRAVENOUS
  Filled 2019-11-09: qty 1

## 2019-11-09 MED ORDER — OXYCODONE-ACETAMINOPHEN 5-325 MG PO TABS
1.0000 | ORAL_TABLET | Freq: Once | ORAL | Status: AC
  Administered 2019-11-09: 14:00:00 1 via ORAL
  Filled 2019-11-09: qty 1

## 2019-11-09 MED ORDER — ONDANSETRON HCL 4 MG/2ML IJ SOLN
4.0000 mg | Freq: Once | INTRAMUSCULAR | Status: AC
  Administered 2019-11-09: 10:00:00 4 mg via INTRAVENOUS
  Filled 2019-11-09: qty 2

## 2019-11-09 MED ORDER — ENOXAPARIN SODIUM 40 MG/0.4ML ~~LOC~~ SOLN
40.0000 mg | Freq: Two times a day (BID) | SUBCUTANEOUS | Status: DC
  Administered 2019-11-10 – 2019-11-11 (×3): 40 mg via SUBCUTANEOUS
  Filled 2019-11-09 (×3): qty 0.4

## 2019-11-09 MED ORDER — ONDANSETRON HCL 4 MG/2ML IJ SOLN
4.0000 mg | Freq: Four times a day (QID) | INTRAMUSCULAR | Status: AC
  Administered 2019-11-10 (×4): 4 mg via INTRAVENOUS
  Filled 2019-11-09 (×4): qty 2

## 2019-11-09 MED ORDER — SODIUM CHLORIDE 0.9 % IV BOLUS
1000.0000 mL | Freq: Once | INTRAVENOUS | Status: AC
  Administered 2019-11-09: 1000 mL via INTRAVENOUS

## 2019-11-09 MED ORDER — LISINOPRIL 10 MG PO TABS
40.0000 mg | ORAL_TABLET | Freq: Once | ORAL | Status: AC
  Administered 2019-11-09: 40 mg via ORAL
  Filled 2019-11-09: qty 4

## 2019-11-09 MED ORDER — LISINOPRIL 20 MG PO TABS
40.0000 mg | ORAL_TABLET | Freq: Every day | ORAL | Status: DC
  Administered 2019-11-10: 40 mg via ORAL
  Filled 2019-11-09: qty 2

## 2019-11-09 MED ORDER — ALLOPURINOL 100 MG PO TABS
100.0000 mg | ORAL_TABLET | Freq: Every day | ORAL | Status: AC
  Administered 2019-11-10 – 2019-11-11 (×3): 100 mg via ORAL
  Filled 2019-11-09 (×3): qty 1

## 2019-11-09 MED ORDER — DEXTROSE-NACL 5-0.45 % IV SOLN: INTRAVENOUS | Status: AC

## 2019-11-09 MED ORDER — OXYCODONE-ACETAMINOPHEN 5-325 MG PO TABS: 1.0000 | ORAL_TABLET | ORAL | 0 refills | Status: AC | PRN

## 2019-11-09 MED ORDER — TRAZODONE HCL 50 MG PO TABS: 25.0000 mg | ORAL_TABLET | Freq: Every evening | ORAL | Status: DC | PRN

## 2019-11-09 MED ORDER — ONDANSETRON HCL 4 MG PO TABS: 4.0000 mg | ORAL_TABLET | Freq: Three times a day (TID) | ORAL | 0 refills | Status: DC | PRN

## 2019-11-09 MED ORDER — MORPHINE SULFATE (PF) 4 MG/ML IV SOLN
4.0000 mg | Freq: Once | INTRAVENOUS | Status: AC
  Administered 2019-11-09: 10:00:00 4 mg via INTRAVENOUS
  Filled 2019-11-09: qty 1

## 2019-11-09 MED ORDER — LABETALOL HCL 200 MG PO TABS
200.0000 mg | ORAL_TABLET | Freq: Once | ORAL | Status: AC
  Administered 2019-11-09: 22:00:00 200 mg via ORAL
  Filled 2019-11-09: qty 1

## 2019-11-09 MED ORDER — PROMETHAZINE HCL 25 MG/ML IJ SOLN
25.0000 mg | Freq: Once | INTRAMUSCULAR | Status: AC
  Administered 2019-11-09: 21:00:00 25 mg via INTRAVENOUS
  Filled 2019-11-09: qty 1

## 2019-11-09 MED ORDER — SODIUM CHLORIDE 0.9 % IV BOLUS
1000.0000 mL | Freq: Once | INTRAVENOUS | Status: AC
  Administered 2019-11-09: 12:00:00 1000 mL via INTRAVENOUS

## 2019-11-09 MED ORDER — LABETALOL HCL 100 MG PO TABS
100.0000 mg | ORAL_TABLET | Freq: Two times a day (BID) | ORAL | Status: DC
  Administered 2019-11-10 – 2019-11-11 (×4): 100 mg via ORAL
  Filled 2019-11-09 (×5): qty 1

## 2019-11-09 MED ORDER — AMLODIPINE BESYLATE 10 MG PO TABS
10.0000 mg | ORAL_TABLET | Freq: Every day | ORAL | Status: DC
  Administered 2019-11-10 – 2019-11-11 (×3): 10 mg via ORAL
  Filled 2019-11-09 (×3): qty 1

## 2019-11-09 NOTE — ED Notes (Signed)
Pt transported to CT ?

## 2019-11-09 NOTE — ED Triage Notes (Signed)
Patient reports having right upper quad pain with vomiting that started tonight.

## 2019-11-09 NOTE — Discharge Instructions (Addendum)
As we discussed it is extremely important [facility]  Please return or be seen immediately for any worsening pain, fevers, persistent vomiting, bloody vomiting or stool, concerns for dehydration or any other new or concerning symptoms.

## 2019-11-09 NOTE — ED Notes (Signed)
Pt c/o RUQ pain and also mild nausea. Dr. Archie Balboa, MD made aware.

## 2019-11-09 NOTE — H&P (Signed)
History and Physical    Marc Neal BTD:[id number] DOB: 1981-05-27 DOA: 11/09/2019  PCP: [facility]   Patient coming from: home  I have personally briefly reviewed patient's old medical records in [place]  Chief Complaint: intractable N/V and abdominal pain.  HPI: Marc Neal is a 39 y.o. male  returns to the emergency department for intractable nausea and vomiting, abdominal pain.  Marc Neal reports he began having N/V Saturday morning followed by generalized abdominal pain. Saturday night the pain got worse leading him to come to the ED this AM. He  does report that the pain became more localized to the RLQ over the course of the day. Patient was evaluated this morning, it was felt the patient had  severe enteritis.  Initially there was some concern for obstruction on CT scan but surgery, Dr. Hampton Abbot, evaluated the patient and did not believe that this was a surgical problem for an obstruction or volvulus at that time.  Patient was recommended to stay in the hospital but at that time wished to proceed home.  Patient states that he proceeded home, was using the prescribed pain medication and antiemetic without relief.  Patient states that the pain has been worsening, and the nausea is intractable even with Zofran.  He reports having an episode of hematemesis with stringy clot x 1 earlier today. Patient has not been able to keep any fluid, food down on return home.  Patient also states that he is unable to take his other regular medications to include blood pressure and allopurinol.  Patient returns requesting admission at this time as he does not feel he can manage his symptoms outpatient.  No new complaints of fevers or chills, URI symptoms.   Marc Neal reports that he had hematuria about a week ago along with scrotal swelling and some flank discomfort. He went to the [place] for evaluation and was told he may have passed a kidney stone.   ED Course: Patient had lab in  AM notable for glucose 140, lactic acid 0.9, noleukocytosis, Cr 3.6 (same as last lab at [facility] in [place]). CT in AM with small intestinal wall thickening c/w enteritis but also possible obstruction or volvulus. GS, Dr. Hampton Abbot, opined there was not a surigcal situation at that time. No new labs done. [facility] called to admit patient for pain control, control of N/V.  Review of Systems: As per HPI otherwise 10 point review of systems negative.    Past Medical History:  Diagnosis Date  . Gout   . HTN (hypertension)   . Obesity     Past Surgical History:  Procedure Laterality Date  . NO PAST SURGERIES     Soc Hx - HSG. 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 His last assignment was at the [employer]. He married 4 years ago. He lives with his wife and two step-children. 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    reports that he has never smoked. He has never used smokeless tobacco. He reports current alcohol use. He reports that he does not use drugs.  No Known Allergies  Family History  Problem Relation Age of Onset  . Obesity Other      Prior to Admission medications   Medication Sig Start Date End Date Taking? Authorizing Provider  allopurinol (ZYLOPRIM) 100 MG tablet Take 100 mg by mouth daily.    [provider]  amLODipine (NORVASC) 10 MG tablet Take  1 tablet (10 mg total) by mouth daily. 01/25/17   Dustin Flock, MD  cholecalciferol (VITAMIN D) 1000 units tablet Take 1,000 Units by mouth daily.    [provider]  labetalol (NORMODYNE) 300 MG tablet Take 1 tablet (300 mg total) by mouth 2 (two) times daily. 01/24/17   Dustin Flock, MD  lisinopril (PRINIVIL,ZESTRIL) 40 MG tablet Take 1 tablet (40 mg total) by mouth daily. 01/25/17   Dustin Flock, MD  ondansetron (ZOFRAN) 4 MG tablet Take 1 tablet (4 mg total) by mouth every 8 (eight) hours as needed. 11/09/19   Nance Pear, MD  oxyCODONE-acetaminophen (PERCOCET)  5-325 MG tablet Take 1 tablet by mouth every 4 (four) hours as needed for severe pain. 11/09/19 11/08/20  Nance Pear, MD    Physical Exam: Vitals:   11/09/19 1817 11/09/19 2119 11/09/19 2228 11/09/19 2231  BP:  (!) 187/135 (!) 166/121 (!) 166/121  Pulse:   99 99  Resp:    18  Temp:      TempSrc:      SpO2:    99%  Weight: (!) 149.7 kg     Height: 6\' 3"  (1.905 m)       Constitutional: NAD, calm, comfortable Vitals:   11/09/19 1817 11/09/19 2119 11/09/19 2228 11/09/19 2231  BP:  (!) 187/135 (!) 166/121 (!) 166/121  Pulse:   99 99  Resp:    18  Temp:      TempSrc:      SpO2:    99%  Weight: (!) 149.7 kg     Height: 6\' 3"  (1.905 m)      General -  Obese man in no acute distress. Eyes: PERRL, lids and conjunctivae normal ENMT: Mucous membranes are moist. Posterior pharynx clear of any exudate or lesions.Normal dentition.  Neck: normal, supple, no masses, no thyromegaly Respiratory: clear to auscultation bilaterally, no wheezing, no crackles. Normal respiratory effort. No accessory muscle use.  Cardiovascular: Regular rate and rhythm, no murmurs / rubs / gallops. No extremity edema. 2+ pedal pulses. No carotid bruits.  Abdomen: BS + x 4. Tender to palpation and percussion with point of maximal tenderness RLQ. Percussion in other quadrants refers pain to RLQ.  No hepatosplenomegaly but exam hindered by girth. No guarding or rebound tenderness.  Musculoskeletal: no clubbing / cyanosis. No joint deformity upper and lower extremities. Good ROM, no contractures. Normal muscle tone.  Skin: no rashes, lesions, ulcers. No induration Neurologic: CN 2-12 grossly intact. Sensation intact, DTR normal. Strength 5/5 in all 4.  Psychiatric: Normal judgment and insight. Alert and oriented x 3. Normal mood.     Labs on Admission: I have personally reviewed following labs and imaging studies  CBC: Recent Labs  Lab 11/09/19 0552  WBC 9.0  HGB 12.8*  HCT 38.6*  MCV 83.2  PLT 536    Basic Metabolic Panel: Recent Labs  Lab 11/09/19 0552  NA 140  K 3.8  CL 106  CO2 24  GLUCOSE 140*  BUN 37*  CREATININE 3.63*  CALCIUM 9.5   GFR: Estimated Creatinine Clearance: 43.2 mL/min (A) (by C-G formula based on SCr of 3.63 mg/dL (H)). Liver Function Tests: Recent Labs  Lab 11/09/19 0552  AST 24  ALT 25  ALKPHOS 60  BILITOT 0.5  PROT 8.2*  ALBUMIN 4.5   Recent Labs  Lab 11/09/19 0552  LIPASE 27   No results for input(s): AMMONIA in the last 168 hours. Coagulation Profile: No results for input(s): INR, PROTIME in the  last 168 hours. Cardiac Enzymes: No results for input(s): CKTOTAL, CKMB, CKMBINDEX, TROPONINI in the last 168 hours. BNP (last 3 results) No results for input(s): PROBNP in the last 8760 hours. HbA1C: No results for input(s): HGBA1C in the last 72 hours. CBG: No results for input(s): GLUCAP in the last 168 hours. Lipid Profile: No results for input(s): CHOL, HDL, LDLCALC, TRIG, CHOLHDL, LDLDIRECT in the last 72 hours. Thyroid Function Tests: No results for input(s): TSH, T4TOTAL, FREET4, T3FREE, THYROIDAB in the last 72 hours. Anemia Panel: No results for input(s): VITAMINB12, FOLATE, FERRITIN, TIBC, IRON, RETICCTPCT in the last 72 hours. Urine analysis:    Component Value Date/Time   COLORURINE STRAW (A) 11/09/2019 0552   APPEARANCEUR CLEAR (A) 11/09/2019 0552   LABSPEC 1.010 11/09/2019 0552   PHURINE 6.0 11/09/2019 0552   GLUCOSEU NEGATIVE 11/09/2019 0552   HGBUR SMALL (A) 11/09/2019 0552   BILIRUBINUR NEGATIVE 11/09/2019 [place] 11/09/2019 0552   PROTEINUR 30 (A) 11/09/2019 0552   NITRITE NEGATIVE 11/09/2019 0552   LEUKOCYTESUR NEGATIVE 11/09/2019 0552    Radiological Exams on Admission: CT ABDOMEN PELVIS WO CONTRAST  Result Date: 11/09/2019 CLINICAL DATA:  Acute right upper quadrant abdominal pain. EXAM: CT ABDOMEN AND PELVIS WITHOUT CONTRAST TECHNIQUE: Multidetector CT imaging of the abdomen and pelvis  was performed following the standard protocol without IV contrast. COMPARISON:  None. FINDINGS: Lower chest: No acute abnormality. Hepatobiliary: No gallstones or biliary dilatation is noted. Multiple hepatic cysts are noted. Pancreas: Unremarkable. No pancreatic ductal dilatation or surrounding inflammatory changes. Spleen: Normal in size without focal abnormality. Adrenals/Urinary Tract: Adrenal glands appear normal. Innumerable cysts are seen involving both kidneys consistent with polycystic kidney disease. No hydronephrosis or renal obstruction is noted. No renal or ureteral calculi are noted. Urinary bladder is unremarkable. Stomach/Bowel: The stomach and appendix are unremarkable. There is a loop of small bowel in the central abdomen anteriorly with severe wall thickening and surrounding inflammation consistent with severe enteritis or possibly closed loop obstruction such as volvulus. Vascular/Lymphatic: No significant vascular findings are present. No enlarged abdominal or pelvic lymph nodes. Reproductive: Prostate is unremarkable. Other: Small fat containing periumbilical hernia is noted. Free fluid is noted in the pelvis suggesting inflammation. Musculoskeletal: No acute or significant osseous findings. IMPRESSION: 1. There is a loop of small bowel in the central abdomen with severe wall thickening and surrounding inflammation consistent with severe enteritis or possibly closed loop obstruction such as volvulus. Mild amount of free fluid is noted in the pelvis. 2. Findings consistent with polycystic kidney disease, with multiple hepatic cysts present as well. Electronically Signed   By: Marijo Conception M.D.   On: 11/09/2019 09:34    EKG: Independently reviewed. NSR w/o abnormalities  Assessment/Plan Active Problems:   Abdominal pain   Enteritis   Accelerated hypertension   AKI (acute kidney injury) ([place])   Gout  (please populate well all problems here in Problem List. (For example, if patient  is on BP meds at home and you resume or decide to hold them, it is a problem that needs to be her. Same for CAD, COPD, HLD and so on)   1. GI - patient with abdominal pain, N/V. Exam does NOT suggest obstruction. CT reveals small bowel wall thickening c/w enteritis. Migration of pain to RLQ raises the question of smoldering appendicitis, possible retroflexed. However, no fever or leukocytosis thus no indication for Abx. Hematemesis c/w Mallory-Weis tear from refractory vomiting. Plan Med-surg admit  NPO except sip/meds/ice  chips  IV D5 1/2 NS for hydration  Protonix IV  Dilaudid for pain control  IV Zofran for nausea  F/u abdominal x-ray in AM  F/u CBCD in AM  2. HTN- patient with significant HTN. Plan Continue home meds  IV labetelol for inability to take PO antihypertensives or SBP > 170  3. CKD - patient with CKD in setting of polycystic kidney disease. He did have an elevated Cr to 3.6 last Friday, prior to onset of current illness Plan Hydration  OK to continue ACE-I  F/u Bmet in AM  4. Gout - no flare Plan Continue allopurinol      DVT prophylaxis: lovenox  Code Status: full code  Family Communication: wife present during exam. Answered all questions  Disposition Plan: home when medically stable  Consults called: GS Dr. Hampton Abbot, may need GI consult if symptoms don't improve Admission status: inpatient    Adella Hare MD [facility] Pager [phone]  If 7PM-7AM, please contact night-coverage [email or web address] Password [facility]  11/09/2019, 10:39 PM

## 2019-11-09 NOTE — ED Notes (Signed)
Pt ambulatory to the restroom at this time.  

## 2019-11-09 NOTE — Consult Note (Signed)
Date of Consultation:  11/09/2019  Requesting Physician:  Nance Pear, MD  Reason for Consultation:  Abdominal pain  History of Present Illness: Marc Neal is a 39 y.o. male presenting with a one day history of RUQ abdominal pain.  Patient reports that he had old [place] food on 4/15 and then last night around 6 pm started having RUQ abdominal pain.  Patient reports having nausea and two episodes of emesis.  But this did not improve the pain.  He also reports having loose bowel movement yesterday and then somewhat more formed this morning.  However, bowel movements did not improve the pain.  Denies any fevers, chills, chest pain, shortness of breath.  The pain has remained in RUQ with no radiation.    Patient presented to the ED today and his workup showed relatively normal labs with exception of elevated creatinine to 3.63.  He has history of PCKD and reports that recent labs from the [place] are similar.  His WBC is normal at 9.0.  Had a CT scan of abdomen and pelvis without contrast and this showed a segment of small bowel in the mid abdomen with significant wall thickening and associated mesenteric stranding.  Report raises concern for possible severe enteritis vs volvulus.  I have independently viewed the images.  In tracing the small bowel, I do see the segment that is very thickened, but I do not see a mesenteric swirl or bird's beak type of sign in the bowel to indicate twisting, or any significant change in bowel caliber.  Gallbladder is not distended and there is no wall thickening or cholelithiasis.  Past Medical History: Past Medical History:  Diagnosis Date  . Gout   . HTN (hypertension)   . Obesity      Past Surgical History: Past Surgical History:  Procedure Laterality Date  . NO PAST SURGERIES      Home Medications: Prior to Admission medications   Medication Sig Start Date End Date Taking? Authorizing Provider  allopurinol (ZYLOPRIM) 100 MG tablet Take 100 mg by  mouth daily.    [provider]  amLODipine (NORVASC) 10 MG tablet Take 1 tablet (10 mg total) by mouth daily. 01/25/17   Dustin Flock, MD  cholecalciferol (VITAMIN D) 1000 units tablet Take 1,000 Units by mouth daily.    [provider]  labetalol (NORMODYNE) 300 MG tablet Take 1 tablet (300 mg total) by mouth 2 (two) times daily. 01/24/17   Dustin Flock, MD  lisinopril (PRINIVIL,ZESTRIL) 40 MG tablet Take 1 tablet (40 mg total) by mouth daily. 01/25/17   Dustin Flock, MD  ondansetron (ZOFRAN) 4 MG tablet Take 1 tablet (4 mg total) by mouth every 8 (eight) hours as needed. 11/09/19   Nance Pear, MD  oxyCODONE-acetaminophen (PERCOCET) 5-325 MG tablet Take 1 tablet by mouth every 4 (four) hours as needed for severe pain. 11/09/19 11/08/20  Nance Pear, MD    Allergies: No Known Allergies  Social History:  reports that he has never smoked. He has never used smokeless tobacco. He reports current alcohol use. He reports that he does not use drugs.   Family History: Family History  Problem Relation Age of Onset  . Obesity Other     Review of Systems: Review of Systems  Constitutional: Negative for chills and fever.  HENT: Negative for hearing loss.   Respiratory: Negative for cough.   Cardiovascular: Negative for chest pain.  Gastrointestinal: Positive for abdominal pain, diarrhea, nausea and vomiting. Negative for blood in stool  and constipation.  Genitourinary: Negative for dysuria.  Musculoskeletal: Negative for myalgias.  Skin: Negative for rash.  Neurological: Negative for dizziness.  Psychiatric/Behavioral: Negative for depression.    Physical Exam BP (S) (!) 152/118 (BP Location: Right Arm) Comment: Pt has a hx of HTN and has not has his meds  Pulse 98   Temp 98.6 F (37 C) (Oral)   Resp 20   Ht 6\' 3"  (1.905 m)   Wt (!) 149.7 kg   SpO2 98%   BMI 41.25 kg/m  CONSTITUTIONAL: No acute distress HEENT:  Normocephalic, atraumatic, extraocular  motion intact. NECK: Trachea is midline, and there is no jugular venous distension. RESPIRATORY:  Lungs are clear, and breath sounds are equal bilaterally. Normal respiratory effort without pathologic use of accessory muscles. CARDIOVASCULAR: Heart is regular without murmurs, gallops, or rubs. GI: The abdomen is soft, obese, non-distended, with tenderness to palpation in the RUQ, but negative Murphy's sign.  No tenderness at the umbilical area where the thickened loop is located. There were no palpable masses.  MUSCULOSKELETAL:  Normal muscle strength and tone in all four extremities.  No peripheral edema or cyanosis. SKIN: Skin turgor is normal. There are no pathologic skin lesions.  NEUROLOGIC:  Motor and sensation is grossly normal.  Cranial nerves are grossly intact. PSYCH:  Alert and oriented to person, place and time. Affect is normal.  Laboratory Analysis: Results for orders placed or performed during the hospital encounter of 11/09/19 (from the past 24 hour(s))  Lipase, blood     Status: None   Collection Time: 11/09/19  5:52 AM  Result Value Ref Range   Lipase 27 11 - 51 U/L  Comprehensive metabolic panel     Status: Abnormal   Collection Time: 11/09/19  5:52 AM  Result Value Ref Range   Sodium 140 135 - 145 mmol/L   Potassium 3.8 3.5 - 5.1 mmol/L   Chloride 106 98 - 111 mmol/L   CO2 24 22 - 32 mmol/L   Glucose, Bld 140 (H) 70 - 99 mg/dL   BUN 37 (H) 6 - 20 mg/dL   Creatinine, Ser 3.63 (H) 0.61 - 1.24 mg/dL   Calcium 9.5 8.9 - 10.3 mg/dL   Total Protein 8.2 (H) 6.5 - 8.1 g/dL   Albumin 4.5 3.5 - 5.0 g/dL   AST 24 15 - 41 U/L   ALT 25 0 - 44 U/L   Alkaline Phosphatase 60 38 - 126 U/L   Total Bilirubin 0.5 0.3 - 1.2 mg/dL   GFR calc non Af Amer 20 (L) >60 mL/min   GFR calc Af Amer 23 (L) >60 mL/min   Anion gap 10 5 - 15  CBC     Status: Abnormal   Collection Time: 11/09/19  5:52 AM  Result Value Ref Range   WBC 9.0 4.0 - 10.5 K/uL   RBC 4.64 4.22 - 5.81 MIL/uL    Hemoglobin 12.8 (L) 13.0 - 17.0 g/dL   HCT 38.6 (L) 39.0 - 52.0 %   MCV 83.2 80.0 - 100.0 fL   MCH 27.6 26.0 - 34.0 pg   MCHC 33.2 30.0 - 36.0 g/dL   RDW 12.9 11.5 - 15.5 %   Platelets 214 150 - 400 K/uL   nRBC 0.0 0.0 - 0.2 %  Urinalysis, Complete w Microscopic     Status: Abnormal   Collection Time: 11/09/19  5:52 AM  Result Value Ref Range   Color, Urine STRAW (A) YELLOW   APPearance CLEAR (A) CLEAR  Specific Gravity, Urine 1.010 1.005 - 1.030   pH 6.0 5.0 - 8.0   Glucose, UA NEGATIVE NEGATIVE mg/dL   Hgb urine dipstick SMALL (A) NEGATIVE   Bilirubin Urine NEGATIVE NEGATIVE   Ketones, ur NEGATIVE NEGATIVE mg/dL   Protein, ur 30 (A) NEGATIVE mg/dL   Nitrite NEGATIVE NEGATIVE   Leukocytes,Ua NEGATIVE NEGATIVE   WBC, UA 0-5 0 - 5 WBC/hpf   Bacteria, UA NONE SEEN NONE SEEN   Squamous Epithelial / LPF NONE SEEN 0 - 5   Mucus PRESENT   Lactic acid, plasma     Status: None   Collection Time: 11/09/19 10:03 AM  Result Value Ref Range   Lactic Acid, Venous 0.9 0.5 - 1.9 mmol/L    Imaging: CT ABDOMEN PELVIS WO CONTRAST  Result Date: 11/09/2019 CLINICAL DATA:  Acute right upper quadrant abdominal pain. EXAM: CT ABDOMEN AND PELVIS WITHOUT CONTRAST TECHNIQUE: Multidetector CT imaging of the abdomen and pelvis was performed following the standard protocol without IV contrast. COMPARISON:  None. FINDINGS: Lower chest: No acute abnormality. Hepatobiliary: No gallstones or biliary dilatation is noted. Multiple hepatic cysts are noted. Pancreas: Unremarkable. No pancreatic ductal dilatation or surrounding inflammatory changes. Spleen: Normal in size without focal abnormality. Adrenals/Urinary Tract: Adrenal glands appear normal. Innumerable cysts are seen involving both kidneys consistent with polycystic kidney disease. No hydronephrosis or renal obstruction is noted. No renal or ureteral calculi are noted. Urinary bladder is unremarkable. Stomach/Bowel: The stomach and appendix are  unremarkable. There is a loop of small bowel in the central abdomen anteriorly with severe wall thickening and surrounding inflammation consistent with severe enteritis or possibly closed loop obstruction such as volvulus. Vascular/Lymphatic: No significant vascular findings are present. No enlarged abdominal or pelvic lymph nodes. Reproductive: Prostate is unremarkable. Other: Small fat containing periumbilical hernia is noted. Free fluid is noted in the pelvis suggesting inflammation. Musculoskeletal: No acute or significant osseous findings. IMPRESSION: 1. There is a loop of small bowel in the central abdomen with severe wall thickening and surrounding inflammation consistent with severe enteritis or possibly closed loop obstruction such as volvulus. Mild amount of free fluid is noted in the pelvis. 2. Findings consistent with polycystic kidney disease, with multiple hepatic cysts present as well. Electronically Signed   By: Marijo Conception M.D.   On: 11/09/2019 09:34    Assessment and Plan: This is a 39 y.o. male with RUQ abdominal pain.  Discussed with the patient that after reviewing the CT scan, I would be more leaning towards enteritis rather than an acute abdomen like volvulus.  After CT scan was done, a lactic acid level was obtained and it was normal at 0.9.  I think this may be an episode of enteritis rather.  However, discussed with the patient that I could not guarantee this completely, as the CT scan was done without any contrast which would make it easier to distinguish between issues.  I offered the patient the options of admission for observation vs home discharge.  If he is admitted, would remain NPO with serial abdominal exams and labs, and if there is any worsening, could potentially make the decision for exploration in OR.  If patient is discharged home, I informed him of the symptoms to look out for and return precautions, particularly any fever, chills, worsening pain, spreading/more  diffuse pain, or other concerns.  The patient ultimately opted for home discharge.  I recommended he get a prescription for pain as well as nausea control.  Patient is  in agreement with this plan and all of his questions have been answered.  Discussed with Dr. Archie Balboa.   Melvyn Neth, MD [facility] Pg:  [phone]

## 2019-11-09 NOTE — ED Provider Notes (Signed)
[facility] Emergency Department Provider Note  ____________________________________________  Time seen: Approximately 8:08 PM  I have reviewed the triage vital signs and the nursing notes.   HISTORY  Chief Complaint Abdominal Pain and Emesis    HPI Marc Neal is a 39 y.o. male who returns to the emergency department for intractable nausea and vomiting, abdominal pain.  Patient was evaluated this morning, it was felt the patient had an AKI, severe enteritis.  Initially there was some concern for obstruction on CT scan but surgery evaluated in the not believe that this was a surgical or an obstruction.  Patient was recommended to stay in the hospital but at that time wished to proceed home.   Patient states that he proceeded home, was using the prescribed pain medication and antiemetic without relief.  Patient states that the pain has been worsening, and the nausea is intractable even with Zofran.  Patient has not been able to keep any fluid, food down on return home.  Patient also states that he is unable to take his other regular medications to include blood pressure and allopurinol.  Patient returns requesting admission at this time as he does not feel he can manage his symptoms outpatient.  No new complaints of fevers or chills, URI symptoms.        Past Medical History:  Diagnosis Date  . Gout   . HTN (hypertension)   . Obesity     Patient Active Problem List   Diagnosis Date Noted  . Abdominal pain   . Enteritis   . Accelerated hypertension 01/23/2017  . AKI (acute kidney injury) ([place]) 01/23/2017  . Gout 01/23/2017  . SOB (shortness of breath) 01/23/2017    Past Surgical History:  Procedure Laterality Date  . NO PAST SURGERIES      Prior to Admission medications   Medication Sig Start Date End Date Taking? Authorizing Provider  allopurinol (ZYLOPRIM) 100 MG tablet Take 100 mg by mouth daily.    [provider]  amLODipine  (NORVASC) 10 MG tablet Take 1 tablet (10 mg total) by mouth daily. 01/25/17   Dustin Flock, MD  cholecalciferol (VITAMIN D) 1000 units tablet Take 1,000 Units by mouth daily.    [provider]  labetalol (NORMODYNE) 300 MG tablet Take 1 tablet (300 mg total) by mouth 2 (two) times daily. 01/24/17   Dustin Flock, MD  lisinopril (PRINIVIL,ZESTRIL) 40 MG tablet Take 1 tablet (40 mg total) by mouth daily. 01/25/17   Dustin Flock, MD  ondansetron (ZOFRAN) 4 MG tablet Take 1 tablet (4 mg total) by mouth every 8 (eight) hours as needed. 11/09/19   Nance Pear, MD  oxyCODONE-acetaminophen (PERCOCET) 5-325 MG tablet Take 1 tablet by mouth every 4 (four) hours as needed for severe pain. 11/09/19 11/08/20  Nance Pear, MD    Allergies Patient has no known allergies.  Family History  Problem Relation Age of Onset  . Obesity Other     Social History Social History   Tobacco Use  . Smoking status: Never Smoker  . Smokeless tobacco: Never Used  Substance Use Topics  . Alcohol use: Yes  . Drug use: No     Review of Systems  Constitutional: No fever/chills Eyes: No visual changes. No discharge ENT: No upper respiratory complaints. Cardiovascular: no chest pain. Respiratory: no cough. No SOB. Gastrointestinal: Sharp abdominal pain.  Nausea, emesis.  No diarrhea.  No constipation. Genitourinary: Negative for dysuria. No hematuria Musculoskeletal: Negative for musculoskeletal pain. Skin: Negative  for rash, abrasions, lacerations, ecchymosis. Neurological: Negative for headaches, focal weakness or numbness. 10-point ROS otherwise negative.  ____________________________________________   PHYSICAL EXAM:  VITAL SIGNS: ED Triage Vitals  Enc Vitals Group     BP 11/09/19 1816 (!) 183/121     Pulse Rate 11/09/19 1816 (!) 112     Resp 11/09/19 1816 19     Temp 11/09/19 1816 99.1 F (37.3 C)     Temp Source 11/09/19 1816 Oral     SpO2 11/09/19 1816 97 %     Weight  11/09/19 1817 (!) 330 lb (149.7 kg)     Height 11/09/19 1817 6\' 3"  (1.905 m)     Head Circumference --      Peak Flow --      Pain Score 11/09/19 1827 7     Pain Loc --      Pain Edu? --      Excl. in [place]? --      Constitutional: Alert and oriented.  Ill appearing but in no acute distress. Eyes: Conjunctivae are normal. PERRL. EOMI. Head: Atraumatic. ENT:      Ears:       Nose: No congestion/rhinnorhea.      Mouth/Throat: Mucous membranes are moist.  Neck: No stridor.    Cardiovascular: Normal rate, regular rhythm. Normal S1 and S2.  Good peripheral circulation. Respiratory: Normal respiratory effort without tachypnea or retractions. Lungs CTAB. Good air entry to the bases with no decreased or absent breath sounds. Gastrointestinal: Bowel sounds 4 quadrants.  Soft to palpation all quadrants.  Diffuse tenderness throughout the abdomen, may be slightly worse on the right side when compared to the left.  No guarding or rigidity. No palpable masses. No distention. No CVA tenderness. Musculoskeletal: Full range of motion to all extremities. No gross deformities appreciated. Neurologic:  Normal speech and language. No gross focal neurologic deficits are appreciated.  Skin:  Skin is warm, dry and intact. No rash noted. Psychiatric: Mood and affect are normal. Speech and behavior are normal. Patient exhibits appropriate insight and judgement.   ____________________________________________   LABS (all labs ordered are listed, but only abnormal results are displayed)  Labs Reviewed  SARS CORONAVIRUS 2 (TAT 6-24 HRS)   ____________________________________________  EKG   ____________________________________________  RADIOLOGY I personally viewed and evaluated these images from his visit earlier today as part of my medical decision making, as well as reviewing the written report by the radiologist.  CT ABDOMEN PELVIS WO CONTRAST  Result Date: 11/09/2019 CLINICAL DATA:  Acute right  upper quadrant abdominal pain. EXAM: CT ABDOMEN AND PELVIS WITHOUT CONTRAST TECHNIQUE: Multidetector CT imaging of the abdomen and pelvis was performed following the standard protocol without IV contrast. COMPARISON:  None. FINDINGS: Lower chest: No acute abnormality. Hepatobiliary: No gallstones or biliary dilatation is noted. Multiple hepatic cysts are noted. Pancreas: Unremarkable. No pancreatic ductal dilatation or surrounding inflammatory changes. Spleen: Normal in size without focal abnormality. Adrenals/Urinary Tract: Adrenal glands appear normal. Innumerable cysts are seen involving both kidneys consistent with polycystic kidney disease. No hydronephrosis or renal obstruction is noted. No renal or ureteral calculi are noted. Urinary bladder is unremarkable. Stomach/Bowel: The stomach and appendix are unremarkable. There is a loop of small bowel in the central abdomen anteriorly with severe wall thickening and surrounding inflammation consistent with severe enteritis or possibly closed loop obstruction such as volvulus. Vascular/Lymphatic: No significant vascular findings are present. No enlarged abdominal or pelvic lymph nodes. Reproductive: Prostate is unremarkable. Other: Small fat containing periumbilical  hernia is noted. Free fluid is noted in the pelvis suggesting inflammation. Musculoskeletal: No acute or significant osseous findings. IMPRESSION: 1. There is a loop of small bowel in the central abdomen with severe wall thickening and surrounding inflammation consistent with severe enteritis or possibly closed loop obstruction such as volvulus. Mild amount of free fluid is noted in the pelvis. 2. Findings consistent with polycystic kidney disease, with multiple hepatic cysts present as well. Electronically Signed   By: Marijo Conception M.D.   On: 11/09/2019 09:34    ____________________________________________    PROCEDURES  Procedure(s) performed:    Procedures    Medications   amLODipine (NORVASC) tablet 10 mg (has no administration in time range)  labetalol (NORMODYNE) tablet 200 mg (has no administration in time range)  sodium chloride 0.9 % bolus 1,000 mL (1,000 mLs Intravenous New Bag/Given 11/09/19 2114)  promethazine (PHENERGAN) injection 25 mg (25 mg Intravenous Given 11/09/19 2114)  morphine 4 MG/ML injection 4 mg (4 mg Intravenous Given 11/09/19 2111)  lisinopril (ZESTRIL) tablet 40 mg (40 mg Oral Given 11/09/19 2119)     ____________________________________________   INITIAL IMPRESSION / ASSESSMENT AND PLAN / ED COURSE  Pertinent labs & imaging results that were available during my care of the patient were reviewed by me and considered in my medical decision making (see chart for details).  Review of the [place] CSRS was performed in accordance of the [place] prior to dispensing any controlled drugs.           Patient's diagnosis is consistent with severe enteritis, intractable nausea and vomiting.  Patient presents the emergency department after being evaluated this morning.  Patient had abdominal pain, nausea vomiting earlier today.  It was felt the patient had an acute kidney injury as well as severe enteritis.  Patient has a history of polycystic kidney disease.  Creatinine is typically in the upper 1.9-2.0 region and rose to 3.63.  Patient initially there was concern of possible closed-loop obstruction and surgery was consulted and evaluated the patient earlier today.  They did not feel this was a surgical patient and recommended observation, symptom control medications.  Patient had recommendation to be observed/admitted but wanted to proceed home with symptom control medication.  Patient been prescribed pain medication, antiemetics but states that even with antiemetic use he has had significant nausea and vomiting.  Patient states that at this time he does not feel that he can take his other regular prescribed medications.  Patient states that he has not been  able to keep down fluid or liquid today even with the assistance of Zofran and pain medication.  At this time patient will be given fluids, antiemetics, pain medication, and will attempt to provide his hypertension medications after these medications to help alleviate his hypertension here in the emergency department.  Patient denies any other new complaints.  I will consult the hospitalist service for admission.    ____________________________________________  FINAL CLINICAL IMPRESSION(S) / ED DIAGNOSES  Final diagnoses:  Enteritis  Intractable nausea and vomiting  AKI (acute kidney injury) ([place])      NEW MEDICATIONS STARTED DURING THIS VISIT:  ED Discharge Orders    None          This chart was dictated using voice recognition software/Dragon. Despite best efforts to proofread, errors can occur which can change the meaning. Any change was purely unintentional.    Brynda Peon 11/09/19 2120    Lavonia Drafts, MD 11/09/19 2201

## 2019-11-09 NOTE — ED Triage Notes (Signed)
FIRST NURSE NOTE- was here earlier and discharged.  Has been vomiting and unable to keep anything down. Appears in pain.

## 2019-11-09 NOTE — ED Triage Notes (Signed)
Pt to ED via POV c/o abd pain and vomiting. Pt states that he was discharged from ED earlier today. Pt states that he went home and continued to vomit. Pt states that he was told to come back if his symptoms did not improve and they would admit him. Pt appears to feel bad.

## 2019-11-09 NOTE — ED Provider Notes (Signed)
[facility] Emergency Department Provider Note  ____________________________________________   I have reviewed the triage vital signs and the nursing notes.   HISTORY  Chief Complaint Abdominal Pain   History limited by: Not Limited   HPI Marc Neal is a 39 y.o. male who presents to the emergency department today because of concern for abdominal pain.  The pain started last night.  Describes the pain as being somewhat sharp.  It was severe.  It did feel to him like if he could throw up and defecate he would feel better.  He had a hard time defecating but then was able to do it.  He also did have some vomiting.  Neither of these activities however helped to relieve his pain.  The patient states that his pain was somewhat helped by the pain medication given at triage.  Patient denies similar pain in the past.  States he does have a history of polycystic kidney disease.  Recently had blood work done at the [place] and was told that his kidney function is slightly worse than what it used to be.  Records reviewed. Per medical record review patient has a history of HTN, AKI.  Past Medical History:  Diagnosis Date  . Gout   . HTN (hypertension)   . Obesity     Patient Active Problem List   Diagnosis Date Noted  . Accelerated hypertension 01/23/2017  . AKI (acute kidney injury) ([place]) 01/23/2017  . Gout 01/23/2017  . SOB (shortness of breath) 01/23/2017    Past Surgical History:  Procedure Laterality Date  . NO PAST SURGERIES      Prior to Admission medications   Medication Sig Start Date End Date Taking? Authorizing Provider  allopurinol (ZYLOPRIM) 100 MG tablet Take 100 mg by mouth daily.    [provider]  amLODipine (NORVASC) 10 MG tablet Take 1 tablet (10 mg total) by mouth daily. 01/25/17   Dustin Flock, MD  cholecalciferol (VITAMIN D) 1000 units tablet Take 1,000 Units by mouth daily.    [provider]  labetalol (NORMODYNE) 300  MG tablet Take 1 tablet (300 mg total) by mouth 2 (two) times daily. 01/24/17   Dustin Flock, MD  lisinopril (PRINIVIL,ZESTRIL) 40 MG tablet Take 1 tablet (40 mg total) by mouth daily. 01/25/17   Dustin Flock, MD    Allergies Patient has no known allergies.  Family History  Problem Relation Age of Onset  . Obesity Other     Social History Social History   Tobacco Use  . Smoking status: Never Smoker  . Smokeless tobacco: Never Used  Substance Use Topics  . Alcohol use: Yes  . Drug use: No    Review of Systems Constitutional: No fever/chills Eyes: No visual changes. ENT: No sore throat. Cardiovascular: Denies chest pain. Respiratory: Denies shortness of breath. Gastrointestinal: Positive for abdominal pain.  Genitourinary: Negative for dysuria. Musculoskeletal: Negative for back pain. Skin: Negative for rash. Neurological: Negative for headaches, focal weakness or numbness.  ____________________________________________   PHYSICAL EXAM:  VITAL SIGNS: ED Triage Vitals  Enc Vitals Group     BP 11/09/19 0539 (!) 151/111     Pulse Rate 11/09/19 0539 (!) 104     Resp 11/09/19 0539 20     Temp 11/09/19 0539 98.6 F (37 C)     Temp Source 11/09/19 0539 Oral     SpO2 11/09/19 0539 97 %     Weight 11/09/19 0536 (!) 330 lb (149.7 kg)  Height 11/09/19 0536 6\' 3"  (1.905 m)     Head Circumference --      Peak Flow --      Pain Score 11/09/19 0535 10   Constitutional: Alert and oriented.  Eyes: Conjunctivae are normal.  ENT      Head: Normocephalic and atraumatic.      Nose: No congestion/rhinnorhea.      Mouth/Throat: Mucous membranes are moist.      Neck: No stridor. Hematological/Lymphatic/Immunilogical: No cervical lymphadenopathy. Cardiovascular: Normal rate, regular rhythm.  No murmurs, rubs, or gallops. Respiratory: Normal respiratory effort without tachypnea nor retractions. Breath sounds are clear and equal bilaterally. No  wheezes/rales/rhonchi. Gastrointestinal: Soft and somewhat tender diffusely with worse tenderness to the right side. Genitourinary: Deferred Musculoskeletal: Normal range of motion in all extremities. No lower extremity edema. Neurologic:  Normal speech and language. No gross focal neurologic deficits are appreciated.  Skin:  Skin is warm, dry and intact. No rash noted. Psychiatric: Mood and affect are normal. Speech and behavior are normal. Patient exhibits appropriate insight and judgment.  ____________________________________________    LABS (pertinent positives/negatives)  UA clear, small hgb dipstick, protein 30 Lipase 27 CMP na 140, k 3.8, glu 140, cr 3.63 CBC wbc 9.0, hgb 12.8, plt 214 Lactic acid 0.9 ____________________________________________   EKG  I, Nance Pear, attending physician, personally viewed and interpreted this EKG  EKG Time: 0542 Rate: 98 Rhythm: normal sinus rhythm Axis: normal Intervals: qtc 451 QRS: narrow ST changes: no st elevation Impression: normal ekg  ____________________________________________    RADIOLOGY  CT abd pel Thickened small bowel concerning for enteritis vs possible closed loop obstruction  ____________________________________________   PROCEDURES  Procedures  ____________________________________________   INITIAL IMPRESSION / ASSESSMENT AND PLAN / ED COURSE  Pertinent labs & imaging results that were available during my care of the patient were reviewed by me and considered in my medical decision making (see chart for details).   Patient presented to the emergency department today because of concerns for abdominal pain.  Differential would be broad for abdominal pain including intra-abdominal infection, enteritis, kidney stones, dissection amongst other etiologies.  Patient's blood work without any concerning leukocytosis nor did the patient have a fever.  Patient's abdominal exam was notable for tenderness  somewhat diffusely however worse on the right side.  Did have some concern for possible early appendicitis given right-sided pain so CT scan was obtained.  While this did not show appendicitis it did show some thickened bowel concerning for enteritis versus closed-loop obstruction.  I did discuss with Dr. Hampton Abbot.  Lactic acid was negative.  Dr. Hampton Abbot with surgery did evaluate the patient.  At this time feels less likely closed-loop obstruction.  Did offer admission for observation versus discharge with strict return precautions.  Patient chose for discharge.  I also discussed with patient return precautions.  Will give patient prescription for pain and nausea medication.  In addition to the abdominal pain patient's creatinine was elevated.  He does have a history of polycystic kidney disease.  Has history of AKI in the past.  Patient states that he recently had blood work done and I was able to see this blood work through the [facility] app.  Recent creatinine at the [facility] was 3.6.  They are arranging follow-up for this.  At this point then it appears this is somewhat more of a chronic issue since they were going to follow the patient up as an outpatient.  However I did discuss with patient the  importance of making sure he does not become dehydrated given already decreased kidney function.  He was given a liter of IV fluid here in the emergency department.  ___________________________________________   FINAL CLINICAL IMPRESSION(S) / ED DIAGNOSES  Final diagnoses:  Abdominal pain, unspecified abdominal location  Enteritis     Note: This dictation was prepared with Dragon dictation. Any transcriptional errors that result from this process are unintentional     Nance Pear, MD 11/09/19 1402

## 2019-11-09 NOTE — Progress Notes (Signed)
PHARMACIST - PHYSICIAN COMMUNICATION  CONCERNING:  Enoxaparin (Lovenox) for DVT Prophylaxis    RECOMMENDATION: Patient was prescribed enoxaprin 40mg  q24 hours for VTE prophylaxis.   Filed Weights   11/09/19 1817  Weight: (!) 149.7 kg (330 lb)    Body mass index is 41.25 kg/m.  Estimated Creatinine Clearance: 43.2 mL/min (A) (by C-G formula based on SCr of 3.63 mg/dL (H)).   Based on [place] patient is candidate for enoxaparin 40mg  every 12 hour dosing due to BMI being >40.  DESCRIPTION: Pharmacy has adjusted enoxaparin dose per [facility] policy.  Patient is now receiving enoxaparin 40mg  every 12 hours.    Ena Dawley, PharmD Clinical Pharmacist  11/09/2019 9:58 PM

## 2019-11-10 ENCOUNTER — Inpatient Hospital Stay: Payer: No Typology Code available for payment source

## 2019-11-10 DIAGNOSIS — N183 Chronic kidney disease, stage 3 unspecified: Secondary | ICD-10-CM

## 2019-11-10 DIAGNOSIS — M109 Gout, unspecified: Secondary | ICD-10-CM

## 2019-11-10 DIAGNOSIS — I10 Essential (primary) hypertension: Secondary | ICD-10-CM

## 2019-11-10 LAB — CBC WITH DIFFERENTIAL/PLATELET
Abs Immature Granulocytes: 0.03 10*3/uL (ref 0.00–0.07)
Basophils Absolute: 0 10*3/uL (ref 0.0–0.1)
Basophils Relative: 0 %
Eosinophils Absolute: 0 10*3/uL (ref 0.0–0.5)
Eosinophils Relative: 0 %
HCT: 35.8 % — ABNORMAL LOW (ref 39.0–52.0)
Hemoglobin: 11.8 g/dL — ABNORMAL LOW (ref 13.0–17.0)
Immature Granulocytes: 0 %
Lymphocytes Relative: 20 %
Lymphs Abs: 1.6 10*3/uL (ref 0.7–4.0)
MCH: 27.9 pg (ref 26.0–34.0)
MCHC: 33 g/dL (ref 30.0–36.0)
MCV: 84.6 fL (ref 80.0–100.0)
Monocytes Absolute: 0.7 10*3/uL (ref 0.1–1.0)
Monocytes Relative: 9 %
Neutro Abs: 5.5 10*3/uL (ref 1.7–7.7)
Neutrophils Relative %: 71 %
Platelets: 193 10*3/uL (ref 150–400)
RBC: 4.23 MIL/uL (ref 4.22–5.81)
RDW: 13.1 % (ref 11.5–15.5)
WBC: 7.8 10*3/uL (ref 4.0–10.5)
nRBC: 0 % (ref 0.0–0.2)

## 2019-11-10 LAB — HIV ANTIBODY (ROUTINE TESTING W REFLEX): HIV Screen 4th Generation wRfx: NONREACTIVE

## 2019-11-10 LAB — SARS CORONAVIRUS 2 (TAT 6-24 HRS): SARS Coronavirus 2: NEGATIVE

## 2019-11-10 LAB — BASIC METABOLIC PANEL
Anion gap: 6 (ref 5–15)
BUN: 34 mg/dL — ABNORMAL HIGH (ref 6–20)
CO2: 25 mmol/L (ref 22–32)
Calcium: 8.8 mg/dL — ABNORMAL LOW (ref 8.9–10.3)
Chloride: 111 mmol/L (ref 98–111)
Creatinine, Ser: 3.54 mg/dL — ABNORMAL HIGH (ref 0.61–1.24)
GFR calc Af Amer: 24 mL/min — ABNORMAL LOW (ref >60)
GFR calc non Af Amer: 21 mL/min — ABNORMAL LOW (ref >60)
Glucose, Bld: 123 mg/dL — ABNORMAL HIGH (ref 70–99)
Potassium: 4.2 mmol/L (ref 3.5–5.1)
Sodium: 142 mmol/L (ref 135–145)

## 2019-11-10 MED ORDER — DEXTROSE-NACL 5-0.45 % IV SOLN: INTRAVENOUS | Status: AC

## 2019-11-10 MED ORDER — ACETAMINOPHEN 325 MG PO TABS: 650.0000 mg | ORAL_TABLET | Freq: Four times a day (QID) | ORAL | Status: DC | PRN

## 2019-11-10 MED ORDER — PANTOPRAZOLE SODIUM 40 MG IV SOLR
40.0000 mg | Freq: Two times a day (BID) | INTRAVENOUS | Status: AC
  Administered 2019-11-10 (×2): 40 mg via INTRAVENOUS
  Filled 2019-11-10 (×2): qty 40

## 2019-11-10 NOTE — Plan of Care (Signed)
Patient is alert and oriented. No complaints of nausea or vomiting. Sleeping soundly.  Will continue to monitor.  [name]

## 2019-11-10 NOTE — Progress Notes (Signed)
PROGRESS NOTE    Marc CALICA  Neal:[id number] DOB: Aug 25, 1980 DOA: 11/09/2019 PCP: [facility]    Brief Narrative:  Marc Neal is a 39 y.o. male  returns to the emergency department for intractable nausea and vomiting, abdominal pain. Mr. Fedor reports he began having N/V Saturday morning followed by generalized abdominal pain CT in AM with small intestinal wall thickening c/w enteritis but also possible obstruction or volvulus. GS, Dr. Hampton Abbot, opined there was not a surigcal situation at that time. No new labs done.   Consultants:   General surgery, GI  Procedures: CT  Antimicrobials:      Subjective: Feels better as long as he is getting pain medication and antinausea.  Has not tried too much p.o.Take as he is NPO.  No new complaints  Objective: Vitals:   11/09/19 2231 11/09/19 2337 11/09/19 2359 11/10/19 0610  BP: (!) 166/121 (!) 146/103 (!) 148/99 114/67  Pulse: 99 90 90 69  Resp: 18 16 17 18   Temp:   99.9 F (37.7 C) 99 F (37.2 C)  TempSrc:   Oral Oral  SpO2: 99% 99% 98% 98%  Weight:      Height:        Intake/Output Summary (Last 24 hours) at 11/10/2019 0801 Last data filed at 11/10/2019 0409 Gross per 24 hour  Intake 1282.94 ml  Output 400 ml  Net 882.94 ml   Filed Weights   11/09/19 1817  Weight: (!) 149.7 kg    Examination:  General exam: Appears calm and comfortable  Respiratory system: Clear to auscultation. Respiratory effort normal. Cardiovascular system: S1 & S2 heard, RRR. No JVD, murmurs, rubs, gallops or clicks.  Gastrointestinal system: Abdomen is nondistended, soft and nontender.. Normal bowel sounds heard. Central nervous system: Alert and oriented.  Grossly intact Extremities: No edema Skin: Warm dry Psychiatry: Judgement and insight appear normal. Mood & affect appropriate.     Data Reviewed: I have personally reviewed following labs and imaging studies  CBC: Recent Labs  Lab 11/09/19 0552  11/10/19 0427  WBC 9.0 7.8  NEUTROABS  --  5.5  HGB 12.8* 11.8*  HCT 38.6* 35.8*  MCV 83.2 84.6  PLT 214 811   Basic Metabolic Panel: Recent Labs  Lab 11/09/19 0552 11/10/19 0427  NA 140 142  K 3.8 4.2  CL 106 111  CO2 24 25  GLUCOSE 140* 123*  BUN 37* 34*  CREATININE 3.63* 3.54*  CALCIUM 9.5 8.8*   GFR: Estimated Creatinine Clearance: 44.3 mL/min (A) (by C-G formula based on SCr of 3.54 mg/dL (H)). Liver Function Tests: Recent Labs  Lab 11/09/19 0552  AST 24  ALT 25  ALKPHOS 60  BILITOT 0.5  PROT 8.2*  ALBUMIN 4.5   Recent Labs  Lab 11/09/19 0552  LIPASE 27   No results for input(s): AMMONIA in the last 168 hours. Coagulation Profile: No results for input(s): INR, PROTIME in the last 168 hours. Cardiac Enzymes: No results for input(s): CKTOTAL, CKMB, CKMBINDEX, TROPONINI in the last 168 hours. BNP (last 3 results) No results for input(s): PROBNP in the last 8760 hours. HbA1C: No results for input(s): HGBA1C in the last 72 hours. CBG: No results for input(s): GLUCAP in the last 168 hours. Lipid Profile: No results for input(s): CHOL, HDL, LDLCALC, TRIG, CHOLHDL, LDLDIRECT in the last 72 hours. Thyroid Function Tests: No results for input(s): TSH, T4TOTAL, FREET4, T3FREE, THYROIDAB in the last 72 hours. Anemia Panel: No results for input(s): VITAMINB12, FOLATE, FERRITIN, TIBC,  IRON, RETICCTPCT in the last 72 hours. Sepsis Labs: Recent Labs  Lab 11/09/19 1003  LATICACIDVEN 0.9    No results found for this or any previous visit (from the past 240 hour(s)).       Radiology Studies: CT ABDOMEN PELVIS WO CONTRAST  Result Date: 11/09/2019 CLINICAL DATA:  Acute right upper quadrant abdominal pain. EXAM: CT ABDOMEN AND PELVIS WITHOUT CONTRAST TECHNIQUE: Multidetector CT imaging of the abdomen and pelvis was performed following the standard protocol without IV contrast. COMPARISON:  None. FINDINGS: Lower chest: No acute abnormality. Hepatobiliary: No  gallstones or biliary dilatation is noted. Multiple hepatic cysts are noted. Pancreas: Unremarkable. No pancreatic ductal dilatation or surrounding inflammatory changes. Spleen: Normal in size without focal abnormality. Adrenals/Urinary Tract: Adrenal glands appear normal. Innumerable cysts are seen involving both kidneys consistent with polycystic kidney disease. No hydronephrosis or renal obstruction is noted. No renal or ureteral calculi are noted. Urinary bladder is unremarkable. Stomach/Bowel: The stomach and appendix are unremarkable. There is a loop of small bowel in the central abdomen anteriorly with severe wall thickening and surrounding inflammation consistent with severe enteritis or possibly closed loop obstruction such as volvulus. Vascular/Lymphatic: No significant vascular findings are present. No enlarged abdominal or pelvic lymph nodes. Reproductive: Prostate is unremarkable. Other: Small fat containing periumbilical hernia is noted. Free fluid is noted in the pelvis suggesting inflammation. Musculoskeletal: No acute or significant osseous findings. IMPRESSION: 1. There is a loop of small bowel in the central abdomen with severe wall thickening and surrounding inflammation consistent with severe enteritis or possibly closed loop obstruction such as volvulus. Mild amount of free fluid is noted in the pelvis. 2. Findings consistent with polycystic kidney disease, with multiple hepatic cysts present as well. Electronically Signed   By: Marijo Conception M.D.   On: 11/09/2019 09:34        Scheduled Meds: . allopurinol  100 mg Oral Daily  . amLODipine  10 mg Oral Daily  . enoxaparin (LOVENOX) injection  40 mg Subcutaneous Q12H  . labetalol  100 mg Oral BID  . ondansetron (ZOFRAN) IV  4 mg Intravenous Q6H   Continuous Infusions: . dextrose 5 % and 0.45% NaCl 75 mL/hr at 11/10/19 0409    Assessment & Plan:   Active Problems:   Accelerated hypertension   AKI (acute kidney injury)  ([place])   Gout   Abdominal pain   Enteritis   1. abd pain/n/v -  Exam does NOT suggest obstruction. CT reveals small bowel wall thickening c/w enteritis. Migration of pain to RLQ raises the question of smoldering appendicitis, possible retroflexed. However, no fever or leukocytosis thus no indication for Abx. Hematemesis c/w Mallory-Weis tear from refractory vomiting. GI input was appreciated: -We will start clear diet -GI recommends conservative management, check fecal calprotectin and his blood checked for CRP The patient may need further evaluation with a small bowel follow-through or CT enterography in the future  2. HTN- patient with significant HTN. Plan Continue home meds IV labetelol for inability to take PO antihypertensives or SBP > 170  3. CKD - patient with CKD in setting of polycystic kidney disease. He did have an elevated Cr to 3.6 last Friday, prior to onset of current illness Plan  Hydration Plan: D/c ACEI             4. Gout - no flare Plan  Continue allopurinol  DVT prophylaxis: lovenox  Code Status: full code  Family Communication: none at bedside. Disposition Plan: home when medically stable  Barrier: Patient is constipated.  With abdominal pain nausea vomiting.  Will try clears.  May need CTenterography if not better.      LOS: 1 day   Time spent: 30 Minutes with more than 50% COC    Nolberto Hanlon, MD [facility] Pager [phone]  If 7PM-7AM, please contact night-coverage [email or web address] Password [email or web address] 11/10/2019, 8:01 AM

## 2019-11-10 NOTE — Progress Notes (Signed)
Order received for a clear liquid diet from Dr Allen Norris

## 2019-11-10 NOTE — Consult Note (Signed)
Lucilla Lame, MD [facility]  [address], [place], [place] [address] Phone: [phone] Fax : [phone]  Consultation  Referring Provider:     Dr. Kurtis Bushman Primary Care Physician:  [facility] Primary Gastroenterologist: Althia Forts         Reason for Consultation:     Nausea and vomiting with abdominal pain  Date of Admission:  11/09/2019 Date of Consultation:  11/10/2019         HPI:   Marc Neal is a 39 y.o. male who reports being in the emergency room earlier yesterday and was sent home after being seen with intractable nausea vomiting and abdominal pain.  The patient then went out to get his prescriptions that were sent out from the ED and started to feel badly again and came back to the emergency room.  The patient reports that he had been in some old food that he reports happened on 4/15.  He reports that his abdominal pain started Saturday morning throughout the entire abdomen and was most prominent on the right lower quadrant as the day progressed.  The patient had a CT scan of the abdomen and was seen by surgery for evaluation of the symptoms.  The CT scan showed:  IMPRESSION: 1. There is a loop of small bowel in the central abdomen with severe wall thickening and surrounding inflammation consistent with severe enteritis or possibly closed loop obstruction such as volvulus. Mild amount of free fluid is noted in the pelvis. 2. Findings consistent with polycystic kidney disease, with multiple hepatic cysts present as well.  The patient had a KUB this morning that showed nonobstructive gas pattern with prominent small bowel loops in the mid abdomen corresponding with the area of bowel wall thickening on the CT scan..  The patient denies any family history of inflammatory bowel disease but it was reported by the patient's companion that the patient's mother had told her that the patient has suffered from GI complaints since he was a child.  There is no report of  any unexplained weight loss.  The companion does report that he had vomiting prior to admission and she thinks that there was blood in the vomitus.  He now reports that his abdominal pain is much better as long as he is taking the medication but he can feel when it is wearing off.  He also reports that he would like to start eating since he is hungry.  Past Medical History:  Diagnosis Date  . Gout   . HTN (hypertension)   . Obesity     Past Surgical History:  Procedure Laterality Date  . NO PAST SURGERIES      Prior to Admission medications   Medication Sig Start Date End Date Taking? Authorizing Provider  allopurinol (ZYLOPRIM) 100 MG tablet Take 100 mg by mouth daily.   Yes [provider]  amLODipine (NORVASC) 10 MG tablet Take 1 tablet (10 mg total) by mouth daily. 01/25/17  Yes Dustin Flock, MD  cholecalciferol (VITAMIN D) 1000 units tablet Take 2,000 Units by mouth daily.    Yes [provider]  labetalol (NORMODYNE) 300 MG tablet Take 1 tablet (300 mg total) by mouth 2 (two) times daily. 01/24/17  Yes Dustin Flock, MD  lisinopril (PRINIVIL,ZESTRIL) 40 MG tablet Take 1 tablet (40 mg total) by mouth daily. 01/25/17  Yes Dustin Flock, MD  ondansetron (ZOFRAN) 4 MG tablet Take 1 tablet (4 mg total) by mouth every 8 (eight) hours as needed.  11/09/19  Yes Nance Pear, MD  oxyCODONE-acetaminophen (PERCOCET) 5-325 MG tablet Take 1 tablet by mouth every 4 (four) hours as needed for severe pain. 11/09/19 11/08/20 Yes Nance Pear, MD    Family History  Problem Relation Age of Onset  . Obesity Other      Social History   Tobacco Use  . Smoking status: Never Smoker  . Smokeless tobacco: Never Used  Substance Use Topics  . Alcohol use: Yes  . Drug use: No    Allergies as of 11/09/2019  . (No Known Allergies)    Review of Systems:    All systems reviewed and negative except where noted in HPI.   Physical Exam:  Vital signs in last 24 hours: Temp:   [98.2 F (36.8 C)-99.9 F (37.7 C)] 98.2 F (36.8 C) (04/19 0918) Pulse Rate:  [69-112] 70 (04/19 1228) Resp:  [16-20] 18 (04/19 1228) BP: (114-187)/(67-135) 136/97 (04/19 1228) SpO2:  [97 %-99 %] 98 % (04/19 1228) Weight:  [149.7 kg] 149.7 kg (04/18 1817) Last BM Date: 11/09/19 General:   Pleasant, cooperative in NAD Head:  Normocephalic and atraumatic. Eyes:   No icterus.   Conjunctiva pink. PERRLA. Ears:  Normal auditory acuity. Neck:  Supple; no masses or thyroidomegaly Lungs: Respirations even and unlabored. Lungs clear to auscultation bilaterally.   No wheezes, crackles, or rhonchi.  Heart:  Regular rate and rhythm;  Without murmur, clicks, rubs or gallops Abdomen:  Soft, nondistended, mild diffuse tenderness. Normal bowel sounds. No appreciable masses or hepatomegaly.  No rebound or guarding.  Rectal:  Not performed. Msk:  Symmetrical without gross deformities.   Extremities:  Without edema, cyanosis or clubbing. Neurologic:  Alert and oriented x3;  grossly normal neurologically. Skin:  Intact without significant lesions or rashes. Cervical Nodes:  No significant cervical adenopathy. Psych:  Alert and cooperative. Normal affect.  LAB RESULTS: Recent Labs    11/09/19 0552 11/10/19 0427  WBC 9.0 7.8  HGB 12.8* 11.8*  HCT 38.6* 35.8*  PLT 214 193   BMET Recent Labs    11/09/19 0552 11/10/19 0427  NA 140 142  K 3.8 4.2  CL 106 111  CO2 24 25  GLUCOSE 140* 123*  BUN 37* 34*  CREATININE 3.63* 3.54*  CALCIUM 9.5 8.8*   LFT Recent Labs    11/09/19 0552  PROT 8.2*  ALBUMIN 4.5  AST 24  ALT 25  ALKPHOS 60  BILITOT 0.5   PT/INR No results for input(s): LABPROT, INR in the last 72 hours.  STUDIES: CT ABDOMEN PELVIS WO CONTRAST  Result Date: 11/09/2019 CLINICAL DATA:  Acute right upper quadrant abdominal pain. EXAM: CT ABDOMEN AND PELVIS WITHOUT CONTRAST TECHNIQUE: Multidetector CT imaging of the abdomen and pelvis was performed following the standard  protocol without IV contrast. COMPARISON:  None. FINDINGS: Lower chest: No acute abnormality. Hepatobiliary: No gallstones or biliary dilatation is noted. Multiple hepatic cysts are noted. Pancreas: Unremarkable. No pancreatic ductal dilatation or surrounding inflammatory changes. Spleen: Normal in size without focal abnormality. Adrenals/Urinary Tract: Adrenal glands appear normal. Innumerable cysts are seen involving both kidneys consistent with polycystic kidney disease. No hydronephrosis or renal obstruction is noted. No renal or ureteral calculi are noted. Urinary bladder is unremarkable. Stomach/Bowel: The stomach and appendix are unremarkable. There is a loop of small bowel in the central abdomen anteriorly with severe wall thickening and surrounding inflammation consistent with severe enteritis or possibly closed loop obstruction such as volvulus. Vascular/Lymphatic: No significant vascular findings are present. No enlarged  abdominal or pelvic lymph nodes. Reproductive: Prostate is unremarkable. Other: Small fat containing periumbilical hernia is noted. Free fluid is noted in the pelvis suggesting inflammation. Musculoskeletal: No acute or significant osseous findings. IMPRESSION: 1. There is a loop of small bowel in the central abdomen with severe wall thickening and surrounding inflammation consistent with severe enteritis or possibly closed loop obstruction such as volvulus. Mild amount of free fluid is noted in the pelvis. 2. Findings consistent with polycystic kidney disease, with multiple hepatic cysts present as well. Electronically Signed   By: Marijo Conception M.D.   On: 11/09/2019 09:34   Abd 1 View (KUB)  Result Date: 11/10/2019 CLINICAL DATA:  Enteritis. EXAM: ABDOMEN - 1 VIEW COMPARISON:  CT abdomen and pelvis 11/09/2019 FINDINGS: Supine images of the abdomen were obtained. Bowel gas in the small and large bowel. There is a gas-filled loop of small bowel in the central abdomen and the  configuration could be associated with bowel wall edema and this corresponds with the recent CT findings. No evidence for bowel obstruction. No large abdominal calcifications. Bone structures are unremarkable. IMPRESSION: 1. Nonobstructive bowel gas pattern. 2. Prominent small bowel loop in the mid abdomen corresponds with the area of bowel wall thickening on the recent CT. Electronically Signed   By: Markus Daft M.D.   On: 11/10/2019 09:01      Impression / Plan:   Assessment: Active Problems:   Accelerated hypertension   AKI (acute kidney injury) ([place])   Gout   Abdominal pain   Enteritis   Marc Neal is a 39 y.o. y/o male with an abnormal CT scan showing thickening of the small bowel.  The position of the thickening is unlikely to be reached by a scope.  The patient may have a viral gastroenteritis and less likely inflammatory bowel disease.  The patient states that he is hungry.  He has not had any bowel movements but states he has passed gas.  Plan:  This patient will be started on a clear liquid diet.  He will have his stool sent off for fecal calprotectin and his blood checked for CRP.  The patient will be treated conservatively at this time since there does not appear to be any obstructive lesions.  The patient may need further evaluation with a small bowel follow-through or CT enterography in the future.  The patient has been explained the plan agrees with it.  Thank you for involving me in the care of this patient.      LOS: 1 day   Lucilla Lame, MD  11/10/2019, 1:26 PM Pager [phone] 7am-5pm  Check AMION for 5pm -7am coverage and on weekends   Note: This dictation was prepared with Dragon dictation along with smaller phrase technology. Any transcriptional errors that result from this process are unintentional.

## 2019-11-11 DIAGNOSIS — R1031 Right lower quadrant pain: Secondary | ICD-10-CM | POA: Diagnosis not present

## 2019-11-11 DIAGNOSIS — K529 Noninfective gastroenteritis and colitis, unspecified: Secondary | ICD-10-CM | POA: Diagnosis not present

## 2019-11-11 LAB — C-REACTIVE PROTEIN: CRP: 0.9 mg/dL (ref <1.0)

## 2019-11-11 NOTE — Progress Notes (Signed)
Pt discharged via private vehicle. Discharge instructions explained and given to pt. No further questions at this time. No s/s of distress noted. IV removed.

## 2019-11-11 NOTE — Discharge Summary (Signed)
Marc Neal MWU:[id number] DOB: [birth year] DOA: 11/09/2019  PCP: [facility]  Admit date: 11/09/2019 Discharge date: 11/11/2019  Admitted From: home Disposition:  home  Recommendations for Outpatient Follow-up:  1. Follow up with PCP in 1 week 2. Please obtain BMP/CBC in one week 3. Dr. Allen Norris , GI in one wee     Discharge Condition:Stable CODE STATUS:full  Diet recommendation: Heart Healthy Brief/Interim Summary: Marc Neal is a 39 y.o. male  returns to the emergency department for intractable nausea and vomiting, abdominal pain. Marc Neal reports he began having N/V Saturday morning followed by generalized abdominal pain CT  with small intestinal wall thickening c/w enteritis but also possible obstruction or volvulus. GS, Dr. Hampton Abbot, opined there was not a surigcal situation at that time. GI consulted, recommended conservative therapy as patient may have viral gastroenteritis and less likely inflammatory bowel disease.  He tolerated his liquid diet and is passing flatus.  Has not had any bowel movement. fecal calprotectin and CPR were sent off.  GI recommended small bowel follow-through or CT enterography in the future.  Via chat Dr. Allen Norris felt patient was stable to be discharged home.  Dr. Allen Norris spoke to the patient gave him his card for follow-up.  Discharge Diagnoses:  Active Problems:   Accelerated hypertension   AKI (acute kidney injury) ([place])   Gout   Abdominal pain   Enteritis    Discharge Instructions  Discharge Instructions    Call MD for:  persistant nausea and vomiting   Complete by: As directed    Call MD for:  temperature >100.4   Complete by: As directed    Diet - low sodium heart healthy   Complete by: As directed    Discharge instructions   Complete by: As directed    Follow up with pcp this week for blood work F/u with Dr. Allen Norris GI in one week   Increase activity slowly   Complete by: As directed      Allergies as of 11/11/2019    No Known Allergies     Medication List    TAKE these medications   allopurinol 100 MG tablet Commonly known as: ZYLOPRIM Take 100 mg by mouth daily.   amLODipine 10 MG tablet Commonly known as: NORVASC Take 1 tablet (10 mg total) by mouth daily.   cholecalciferol 1000 units tablet Commonly known as: VITAMIN D Take 2,000 Units by mouth daily.   labetalol 300 MG tablet Commonly known as: NORMODYNE Take 1 tablet (300 mg total) by mouth 2 (two) times daily.   lisinopril 40 MG tablet Commonly known as: ZESTRIL Take 1 tablet (40 mg total) by mouth daily.   ondansetron 4 MG tablet Commonly known as: Zofran Take 1 tablet (4 mg total) by mouth every 8 (eight) hours as needed.   oxyCODONE-acetaminophen 5-325 MG tablet Commonly known as: Percocet Take 1 tablet by mouth every 4 (four) hours as needed for severe pain.      Follow-up Information    Lucilla Lame, MD Follow up in 1 week(s).   Specialty: Gastroenterology Contact information: [place] [address] [phone]          No Known Allergies  Consultations:  Gi, Surgery   Procedures/Studies: CT ABDOMEN PELVIS WO CONTRAST  Result Date: 11/09/2019 CLINICAL DATA:  Acute right upper quadrant abdominal pain. EXAM: CT ABDOMEN AND PELVIS WITHOUT CONTRAST TECHNIQUE: Multidetector CT imaging of the abdomen and pelvis was performed following the standard protocol without IV contrast.  COMPARISON:  None. FINDINGS: Lower chest: No acute abnormality. Hepatobiliary: No gallstones or biliary dilatation is noted. Multiple hepatic cysts are noted. Pancreas: Unremarkable. No pancreatic ductal dilatation or surrounding inflammatory changes. Spleen: Normal in size without focal abnormality. Adrenals/Urinary Tract: Adrenal glands appear normal. Innumerable cysts are seen involving both kidneys consistent with polycystic kidney disease. No hydronephrosis or renal obstruction is noted. No renal or ureteral calculi are  noted. Urinary bladder is unremarkable. Stomach/Bowel: The stomach and appendix are unremarkable. There is a loop of small bowel in the central abdomen anteriorly with severe wall thickening and surrounding inflammation consistent with severe enteritis or possibly closed loop obstruction such as volvulus. Vascular/Lymphatic: No significant vascular findings are present. No enlarged abdominal or pelvic lymph nodes. Reproductive: Prostate is unremarkable. Other: Small fat containing periumbilical hernia is noted. Free fluid is noted in the pelvis suggesting inflammation. Musculoskeletal: No acute or significant osseous findings. IMPRESSION: 1. There is a loop of small bowel in the central abdomen with severe wall thickening and surrounding inflammation consistent with severe enteritis or possibly closed loop obstruction such as volvulus. Mild amount of free fluid is noted in the pelvis. 2. Findings consistent with polycystic kidney disease, with multiple hepatic cysts present as well. Electronically Signed   By: Marijo Conception M.D.   On: 11/09/2019 09:34   Abd 1 View (KUB)  Result Date: 11/10/2019 CLINICAL DATA:  Enteritis. EXAM: ABDOMEN - 1 VIEW COMPARISON:  CT abdomen and pelvis 11/09/2019 FINDINGS: Supine images of the abdomen were obtained. Bowel gas in the small and large bowel. There is a gas-filled loop of small bowel in the central abdomen and the configuration could be associated with bowel wall edema and this corresponds with the recent CT findings. No evidence for bowel obstruction. No large abdominal calcifications. Bone structures are unremarkable. IMPRESSION: 1. Nonobstructive bowel gas pattern. 2. Prominent small bowel loop in the mid abdomen corresponds with the area of bowel wall thickening on the recent CT. Electronically Signed   By: Markus Daft M.D.   On: 11/10/2019 09:01       Subjective:  Feels better.  Tolerated clears.  No abdominal pain, vomiting, or nausea Discharge  Exam: Vitals:   11/11/19 0800 11/11/19 1422  BP: (!) 140/91 (!) 143/101  Pulse: 79 83  Resp: 18 17  Temp: 98.7 F (37.1 C) 98.2 F (36.8 C)  SpO2: 98% 98%   Vitals:   11/10/19 2236 11/11/19 0419 11/11/19 0800 11/11/19 1422  BP: 132/90 129/89 (!) 140/91 (!) 143/101  Pulse: 65 69 79 83  Resp: 20 20 18 17   Temp:  98.2 F (36.8 C) 98.7 F (37.1 C) 98.2 F (36.8 C)  TempSrc:  Oral Oral Oral  SpO2: 93% 99% 98% 98%  Weight:      Height:        General: Pt is alert, awake, not in acute distress Cardiovascular: RRR, S1/S2 +, no rubs, no gallops Respiratory: CTA bilaterally, no wheezing, no rhonchi Abdominal: Soft, NT, ND, bowel sounds + Extremities: no edema, no cyanosis    The results of significant diagnostics from this hospitalization (including imaging, microbiology, ancillary and laboratory) are listed below for reference.     Microbiology: Recent Results (from the past 240 hour(s))  SARS CORONAVIRUS 2 (TAT 6-24 HRS) Nasopharyngeal Nasopharyngeal Swab     Status: None   Collection Time: 11/09/19 10:12 PM   Specimen: Nasopharyngeal Swab  Result Value Ref Range Status   SARS Coronavirus 2 NEGATIVE NEGATIVE Final  Comment: (NOTE) SARS-CoV-2 target nucleic acids are NOT DETECTED. The SARS-CoV-2 RNA is generally detectable in upper and lower respiratory specimens during the acute phase of infection. Negative results do not preclude SARS-CoV-2 infection, do not rule out co-infections with other pathogens, and should not be used as the sole basis for treatment or other patient management decisions. Negative results must be combined with clinical observations, patient history, and epidemiological information. The expected result is Negative. Fact Sheet for Patients: SugarRoll.be Fact Sheet for Healthcare Providers: https://www.woods-mathews.com/ This test is not yet approved or cleared by the [place] FDA and  has been  authorized for detection and/or diagnosis of SARS-CoV-2 by FDA under an Emergency Use Authorization (EUA). This EUA will remain  in effect (meaning this test can be used) for the duration of the COVID-19 declaration under Section 56 4(b)(1) of the Act, 21 U.S.C. section 360bbb-3(b)(1), unless the authorization is terminated or revoked sooner. Performed at [facility], [place] [address] [place], [place] [address]      Labs: BNP (last 3 results) No results for input(s): BNP in the last 8760 hours. Basic Metabolic Panel: Recent Labs  Lab 11/09/19 0552 11/10/19 0427  NA 140 142  K 3.8 4.2  CL 106 111  CO2 24 25  GLUCOSE 140* 123*  BUN 37* 34*  CREATININE 3.63* 3.54*  CALCIUM 9.5 8.8*   Liver Function Tests: Recent Labs  Lab 11/09/19 0552  AST 24  ALT 25  ALKPHOS 60  BILITOT 0.5  PROT 8.2*  ALBUMIN 4.5   Recent Labs  Lab 11/09/19 0552  LIPASE 27   No results for input(s): AMMONIA in the last 168 hours. CBC: Recent Labs  Lab 11/09/19 0552 11/10/19 0427  WBC 9.0 7.8  NEUTROABS  --  5.5  HGB 12.8* 11.8*  HCT 38.6* 35.8*  MCV 83.2 84.6  PLT 214 193   Cardiac Enzymes: No results for input(s): CKTOTAL, CKMB, CKMBINDEX, TROPONINI in the last 168 hours. BNP: Invalid input(s): POCBNP CBG: No results for input(s): GLUCAP in the last 168 hours. D-Dimer No results for input(s): DDIMER in the last 72 hours. Hgb A1c No results for input(s): HGBA1C in the last 72 hours. Lipid Profile No results for input(s): CHOL, HDL, LDLCALC, TRIG, CHOLHDL, LDLDIRECT in the last 72 hours. Thyroid function studies No results for input(s): TSH, T4TOTAL, T3FREE, THYROIDAB in the last 72 hours.  Invalid input(s): FREET3 Anemia work up No results for input(s): VITAMINB12, FOLATE, FERRITIN, TIBC, IRON, RETICCTPCT in the last 72 hours. Urinalysis    Component Value Date/Time   COLORURINE STRAW (A) 11/09/2019 0552   APPEARANCEUR CLEAR (A) 11/09/2019 0552   LABSPEC 1.010  11/09/2019 0552   PHURINE 6.0 11/09/2019 0552   GLUCOSEU NEGATIVE 11/09/2019 0552   HGBUR SMALL (A) 11/09/2019 0552   BILIRUBINUR NEGATIVE 11/09/2019 [place] 11/09/2019 0552   PROTEINUR 30 (A) 11/09/2019 0552   NITRITE NEGATIVE 11/09/2019 0552   LEUKOCYTESUR NEGATIVE 11/09/2019 0552   Sepsis Labs Invalid input(s): PROCALCITONIN,  WBC,  LACTICIDVEN Microbiology Recent Results (from the past 240 hour(s))  SARS CORONAVIRUS 2 (TAT 6-24 HRS) Nasopharyngeal Nasopharyngeal Swab     Status: None   Collection Time: 11/09/19 10:12 PM   Specimen: Nasopharyngeal Swab  Result Value Ref Range Status   SARS Coronavirus 2 NEGATIVE NEGATIVE Final    Comment: (NOTE) SARS-CoV-2 target nucleic acids are NOT DETECTED. The SARS-CoV-2 RNA is generally detectable in upper and lower respiratory specimens during the acute phase of  infection. Negative results do not preclude SARS-CoV-2 infection, do not rule out co-infections with other pathogens, and should not be used as the sole basis for treatment or other patient management decisions. Negative results must be combined with clinical observations, patient history, and epidemiological information. The expected result is Negative. Fact Sheet for Patients: SugarRoll.be Fact Sheet for Healthcare Providers: https://www.woods-mathews.com/ This test is not yet approved or cleared by the [place] FDA and  has been authorized for detection and/or diagnosis of SARS-CoV-2 by FDA under an Emergency Use Authorization (EUA). This EUA will remain  in effect (meaning this test can be used) for the duration of the COVID-19 declaration under Section 56 4(b)(1) of the Act, 21 U.S.C. section 360bbb-3(b)(1), unless the authorization is terminated or revoked sooner. Performed at [facility], [place] [address], [place], [place] [address]      Time coordinating discharge: Over 30  minutes  SIGNED:   Nolberto Hanlon, MD  [facility] 11/11/2019, 2:59 PM Pager   If 7PM-7AM, please contact night-coverage [email or web address] Password [email or web address]

## 2019-11-11 NOTE — Progress Notes (Signed)
Lucilla Lame, MD [facility]   [address], [place], [place] [address] Phone: [phone] Fax : [phone]   Subjective: The patient is doing much better and states he is tolerating a clear liquid diet.  He is not having any abdominal pain at the present time.  He states that he feels close to his baseline.   Objective: Vital signs in last 24 hours: Vitals:   11/10/19 2236 11/11/19 0419 11/11/19 0800 11/11/19 1422  BP: 132/90 129/89 (!) 140/91 (!) 143/101  Pulse: 65 69 79 83  Resp: 20 20 18 17   Temp:  98.2 F (36.8 C) 98.7 F (37.1 C) 98.2 F (36.8 C)  TempSrc:  Oral Oral Oral  SpO2: 93% 99% 98% 98%  Weight:      Height:       Weight change:   Intake/Output Summary (Last 24 hours) at 11/11/2019 1605 Last data filed at 11/11/2019 1335 Gross per 24 hour  Intake 870 ml  Output 2650 ml  Net -1780 ml     Exam: Heart:: Regular rate and rhythm, S1S2 present or without murmur or extra heart sounds Lungs: normal and clear to auscultation and percussion Abdomen: soft, nontender, normal bowel sounds   Lab Results: @LABTEST2 @ Micro Results: Recent Results (from the past 240 hour(s))  SARS CORONAVIRUS 2 (TAT 6-24 HRS) Nasopharyngeal Nasopharyngeal Swab     Status: None   Collection Time: 11/09/19 10:12 PM   Specimen: Nasopharyngeal Swab  Result Value Ref Range Status   SARS Coronavirus 2 NEGATIVE NEGATIVE Final    Comment: (NOTE) SARS-CoV-2 target nucleic acids are NOT DETECTED. The SARS-CoV-2 RNA is generally detectable in upper and lower respiratory specimens during the acute phase of infection. Negative results do not preclude SARS-CoV-2 infection, do not rule out co-infections with other pathogens, and should not be used as the sole basis for treatment or other patient management decisions. Negative results must be combined with clinical observations, patient history, and epidemiological information. The expected result is Negative. Fact Sheet for Patients:  SugarRoll.be Fact Sheet for Healthcare Providers: https://www.woods-mathews.com/ This test is not yet approved or cleared by the [place] FDA and  has been authorized for detection and/or diagnosis of SARS-CoV-2 by FDA under an Emergency Use Authorization (EUA). This EUA will remain  in effect (meaning this test can be used) for the duration of the COVID-19 declaration under Section 56 4(b)(1) of the Act, 21 U.S.C. section 360bbb-3(b)(1), unless the authorization is terminated or revoked sooner. Performed at [facility], [place] [address], [place], [place] [address]    Studies/Results: Abd 1 View (KUB)  Result Date: 11/10/2019 CLINICAL DATA:  Enteritis. EXAM: ABDOMEN - 1 VIEW COMPARISON:  CT abdomen and pelvis 11/09/2019 FINDINGS: Supine images of the abdomen were obtained. Bowel gas in the small and large bowel. There is a gas-filled loop of small bowel in the central abdomen and the configuration could be associated with bowel wall edema and this corresponds with the recent CT findings. No evidence for bowel obstruction. No large abdominal calcifications. Bone structures are unremarkable. IMPRESSION: 1. Nonobstructive bowel gas pattern. 2. Prominent small bowel loop in the mid abdomen corresponds with the area of bowel wall thickening on the recent CT. Electronically Signed   By: Markus Daft M.D.   On: 11/10/2019 09:01   Medications: I have reviewed the patient's current medications. Scheduled Meds: . amLODipine  10 mg Oral Daily  . enoxaparin (LOVENOX) injection  40 mg Subcutaneous Q12H  . labetalol  100  mg Oral BID   Continuous Infusions: PRN Meds:.acetaminophen, HYDROmorphone (DILAUDID) injection, labetalol, traZODone   Assessment: Active Problems:   Accelerated hypertension   AKI (acute kidney injury) ([place])   Gout   Abdominal pain   Enteritis    Plan: The patient appears to be progressing well and has been told to slowly  advance his diet at home.  It is okay from a GI point of view abuse discharge and follow up as an outpatient.  The patient has been explained the plan and agrees with it.   LOS: 2 days   Lucilla Lame 11/11/2019, 4:05 PM Pager [phone] 7am-5pm  Check AMION for 5pm -7am coverage and on weekends

## 2019-12-15 ENCOUNTER — Ambulatory Visit (INDEPENDENT_AMBULATORY_CARE_PROVIDER_SITE_OTHER): Payer: No Typology Code available for payment source | Admitting: Gastroenterology

## 2019-12-15 ENCOUNTER — Other Ambulatory Visit: Payer: Self-pay

## 2019-12-15 ENCOUNTER — Encounter: Payer: Self-pay | Admitting: Gastroenterology

## 2019-12-15 DIAGNOSIS — R112 Nausea with vomiting, unspecified: Secondary | ICD-10-CM

## 2019-12-15 NOTE — Progress Notes (Signed)
Primary Care Physician: [facility]  Primary Gastroenterologist:  Dr. Lucilla Lame  Chief Complaint  Patient presents with  . Follow up ER    nausea and vomiting    HPI: Marc Neal is a 39 y.o. male here for follow-up after being discharged in the hospital.  The patient had a case of enteritis and it quickly resolved after being admitted.  He had been fine after discharge except for some issues with fried foods.  The patient states he has abdominal discomfort with fried foods.  He has been otherwise without issues.  He does report that he has a lot of family members who have had their gallbladder out.  Past Medical History:  Diagnosis Date  . Gout   . HTN (hypertension)   . Obesity     Current Outpatient Medications  Medication Sig Dispense Refill  . allopurinol (ZYLOPRIM) 100 MG tablet Take 100 mg by mouth daily.    Marland Kitchen amLODipine (NORVASC) 10 MG tablet Take 1 tablet (10 mg total) by mouth daily. 30 tablet 0  . cholecalciferol (VITAMIN D) 1000 units tablet Take 2,000 Units by mouth daily.     . ferrous sulfate 324 MG TBEC Take 324 mg by mouth 3 (three) times a week.    . labetalol (NORMODYNE) 300 MG tablet Take 1 tablet (300 mg total) by mouth 2 (two) times daily. 60 tablet 0  . lisinopril (PRINIVIL,ZESTRIL) 40 MG tablet Take 1 tablet (40 mg total) by mouth daily. 30 tablet 0  . ondansetron (ZOFRAN) 4 MG tablet Take 1 tablet (4 mg total) by mouth every 8 (eight) hours as needed. (Patient not taking: Reported on 12/15/2019) 20 tablet 0  . oxyCODONE-acetaminophen (PERCOCET) 5-325 MG tablet Take 1 tablet by mouth every 4 (four) hours as needed for severe pain. (Patient not taking: Reported on 12/15/2019) 20 tablet 0   No current facility-administered medications for this visit.    Allergies as of 12/15/2019  . (No Known Allergies)    ROS:  General: Negative for anorexia, weight loss, fever, chills, fatigue, weakness. ENT: Negative for hoarseness, difficulty  swallowing , nasal congestion. CV: Negative for chest pain, angina, palpitations, dyspnea on exertion, peripheral edema.  Respiratory: Negative for dyspnea at rest, dyspnea on exertion, cough, sputum, wheezing.  GI: See history of present illness. GU:  Negative for dysuria, hematuria, urinary incontinence, urinary frequency, nocturnal urination.  Endo: Negative for unusual weight change.    Physical Examination:   BP (!) 148/85   Pulse 76   Temp 98.8 F (37.1 C) (Temporal)   Ht 6\' 3"  (1.905 m)   Wt (!) 318 lb 12.8 oz (144.6 kg)   BMI 39.85 kg/m   General: Well-nourished, well-developed in no acute distress.  Eyes: No icterus. Conjunctivae pink. Lungs: Clear to auscultation bilaterally. Non-labored. Heart: Regular rate and rhythm, no murmurs rubs or gallops.  Abdomen: Bowel sounds are normal, nontender, nondistended, no hepatosplenomegaly or masses, no abdominal bruits or hernia , no rebound or guarding.   Extremities: No lower extremity edema. No clubbing or deformities. Neuro: Alert and oriented x 3.  Grossly intact. Skin: Warm and dry, no jaundice.   Psych: Alert and cooperative, normal mood and affect.  Labs:    Imaging Studies: No results found.  Assessment and Plan:   Marc Neal is a 39 y.o. y/o male who comes in today with a report of abdominal pain to greasy foods.  The patient has not had any other major issues.  His CRP was negative.  His hospital stay.  The patient will be set up for a right upper quadrant ultrasound with a gallbladder emptying study.  The patient has been explained the plan agrees with it.     Lucilla Lame, MD. [name]    Note: This dictation was prepared with Dragon dictation along with smaller phrase technology. Any transcriptional errors that result from this process are unintentional.

## 2019-12-29 ENCOUNTER — Other Ambulatory Visit: Payer: No Typology Code available for payment source

## 2019-12-30 ENCOUNTER — Other Ambulatory Visit: Payer: No Typology Code available for payment source

## 2020-01-21 ENCOUNTER — Other Ambulatory Visit: Payer: No Typology Code available for payment source

## 2020-04-01 ENCOUNTER — Telehealth: Payer: Self-pay | Admitting: Gastroenterology

## 2020-04-01 NOTE — Telephone Encounter (Signed)
Kyra from [place] wants a call back from the [facility] to get clarification as to what else the patient needs. Testing or follow up visit. Clinical staff was informed.

## 2020-05-05 ENCOUNTER — Other Ambulatory Visit: Payer: Self-pay

## 2020-05-05 DIAGNOSIS — R112 Nausea with vomiting, unspecified: Secondary | ICD-10-CM

## 2020-05-13 ENCOUNTER — Other Ambulatory Visit: Payer: Self-pay

## 2020-05-13 ENCOUNTER — Ambulatory Visit
Admission: RE | Admit: 2020-05-13 | Discharge: 2020-05-13 | Disposition: A | Discharge status: Home / Self Care | Payer: No Typology Code available for payment source | Source: Ambulatory Visit | Attending: Gastroenterology | Admitting: Gastroenterology

## 2020-05-13 ENCOUNTER — Encounter
Admission: RE | Admit: 2020-05-13 | Discharge: 2020-05-13 | Disposition: A | Discharge status: Home / Self Care | Payer: No Typology Code available for payment source | Source: Ambulatory Visit | Attending: Gastroenterology | Admitting: Gastroenterology

## 2020-05-13 DIAGNOSIS — R112 Nausea with vomiting, unspecified: Secondary | ICD-10-CM

## 2020-05-13 MED ORDER — TECHNETIUM TC 99M MEBROFENIN IV KIT
5.2390 | PACK | Freq: Once | INTRAVENOUS | Status: AC | PRN
  Administered 2020-05-13: 5.239 via INTRAVENOUS

## 2020-05-20 ENCOUNTER — Telehealth: Payer: Self-pay

## 2020-05-20 NOTE — Telephone Encounter (Signed)
-----   Message from Lucilla Lame, MD sent at 05/20/2020  9:00 AM EDT ----- Let the patient know that the CT scan showed no gallstones with some fatty liver seen and multiple cysts in the liver and cysts seen in the right kidney.  This is unlikely because of any symptoms.  The patient also had a gallbladder emptying study that was normal.  Please have the patient return to the office if he is still having problems to discuss further work-up.

## 2020-05-20 NOTE — Telephone Encounter (Signed)
Left vm with results of [place] and HIDA scan. Advised pt if he was still having symptoms to contact our office for a follow up appt.   Faxed reports to the [place] per their request. [phone]

## 2020-07-26 ENCOUNTER — Telehealth: Payer: Self-pay | Admitting: Gastroenterology

## 2020-07-26 NOTE — Telephone Encounter (Signed)
Requested Medical records faxed by Sharyn Lull from [facility] to [facility] on 12.16.21. FYI

## 2020-12-05 ENCOUNTER — Emergency Department
Admission: EM | Admit: 2020-12-05 | Discharge: 2020-12-05 | Disposition: A | Discharge status: Home / Self Care | Payer: No Typology Code available for payment source | Attending: Emergency Medicine | Admitting: Emergency Medicine

## 2020-12-05 ENCOUNTER — Emergency Department: Payer: No Typology Code available for payment source

## 2020-12-05 ENCOUNTER — Other Ambulatory Visit: Payer: Self-pay

## 2020-12-05 DIAGNOSIS — N184 Chronic kidney disease, stage 4 (severe): Secondary | ICD-10-CM | POA: Insufficient documentation

## 2020-12-05 DIAGNOSIS — R002 Palpitations: Secondary | ICD-10-CM

## 2020-12-05 DIAGNOSIS — Z79899 Other long term (current) drug therapy: Secondary | ICD-10-CM | POA: Diagnosis not present

## 2020-12-05 DIAGNOSIS — E860 Dehydration: Secondary | ICD-10-CM | POA: Insufficient documentation

## 2020-12-05 DIAGNOSIS — I129 Hypertensive chronic kidney disease with stage 1 through stage 4 chronic kidney disease, or unspecified chronic kidney disease: Secondary | ICD-10-CM | POA: Diagnosis not present

## 2020-12-05 HISTORY — DX: Polycystic kidney, unspecified: Q61.3

## 2020-12-05 LAB — BASIC METABOLIC PANEL
Anion gap: 11 (ref 5–15)
BUN: 49 mg/dL — ABNORMAL HIGH (ref 6–20)
CO2: 19 mmol/L — ABNORMAL LOW (ref 22–32)
Calcium: 9.3 mg/dL (ref 8.9–10.3)
Chloride: 108 mmol/L (ref 98–111)
Creatinine, Ser: 5.56 mg/dL — ABNORMAL HIGH (ref 0.61–1.24)
GFR, Estimated: 13 mL/min — ABNORMAL LOW (ref >60)
Glucose, Bld: 124 mg/dL — ABNORMAL HIGH (ref 70–99)
Potassium: 4.3 mmol/L (ref 3.5–5.1)
Sodium: 138 mmol/L (ref 135–145)

## 2020-12-05 LAB — CBC
HCT: 36.2 % — ABNORMAL LOW (ref 39.0–52.0)
Hemoglobin: 11.9 g/dL — ABNORMAL LOW (ref 13.0–17.0)
MCH: 28.1 pg (ref 26.0–34.0)
MCHC: 32.9 g/dL (ref 30.0–36.0)
MCV: 85.4 fL (ref 80.0–100.0)
Platelets: 160 10*3/uL (ref 150–400)
RBC: 4.24 MIL/uL (ref 4.22–5.81)
RDW: 12.9 % (ref 11.5–15.5)
WBC: 5.1 10*3/uL (ref 4.0–10.5)
nRBC: 0 % (ref 0.0–0.2)

## 2020-12-05 LAB — TROPONIN I (HIGH SENSITIVITY)
Troponin I (High Sensitivity): 8 ng/L (ref <18)
Troponin I (High Sensitivity): 7 ng/L (ref <18)

## 2020-12-05 LAB — TSH: TSH: 0.578 u[IU]/mL (ref 0.350–4.500)

## 2020-12-05 LAB — T4, FREE: Free T4: 0.88 ng/dL (ref 0.61–1.12)

## 2020-12-05 LAB — PROTIME-INR
INR: 1.1 (ref 0.8–1.2)
Prothrombin Time: 13.7 seconds (ref 11.4–15.2)

## 2020-12-05 LAB — MAGNESIUM: Magnesium: 1.8 mg/dL (ref 1.7–2.4)

## 2020-12-05 MED ORDER — SODIUM CHLORIDE 0.9 % IV BOLUS
500.0000 mL | Freq: Once | INTRAVENOUS | Status: AC
  Administered 2020-12-05: 500 mL via INTRAVENOUS

## 2020-12-05 NOTE — ED Provider Notes (Signed)
[facility] Emergency Department Provider Note   ____________________________________________   Event Date/Time   First MD Initiated Contact with Patient 12/05/20 [phone]     (approximate)  I have reviewed the triage vital signs and the nursing notes.   HISTORY  Chief Complaint Palpitations    HPI Marc Neal is a 40 y.o. male who presents to the ED from home with a chief complaint of palpitations.  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  Patient started 10 mg hydralazine last week; yesterday was the first day he increased to 25 mg.  States he had been working all day and was cleaning up around the house around 8 PM when he experienced heart palpitations associated with mild exertional shortness of breath.  Also notes he had not eaten much and thinks he is dehydrated.  Denies fever, cough, chest pain, abdominal pain, nausea, vomiting or dizziness.  Denies consumption of coffee, teas, sodas x2 weeks.  Will have the occasional energy drink but has not had any this week.  Has been eating several [place] peppermint patties this week.  Denies recent travel or trauma.     Past Medical History:  Diagnosis Date  . Gout   . HTN (hypertension)   . Obesity   . Polycystic kidney     Patient Active Problem List   Diagnosis Date Noted  . Abdominal pain   . Enteritis   . Accelerated hypertension 01/23/2017  . AKI (acute kidney injury) ([place]) 01/23/2017  . Gout 01/23/2017  . SOB (shortness of breath) 01/23/2017    Past Surgical History:  Procedure Laterality Date  . AV FISTULA PLACEMENT Right   . KNEE SURGERY    . NO PAST SURGERIES      Prior to Admission medications   Medication Sig Start Date End Date Taking? Authorizing Provider  allopurinol (ZYLOPRIM) 100 MG tablet Take 100 mg by mouth daily.    [provider]  amLODipine (NORVASC) 10 MG tablet Take 1 tablet (10 mg  total) by mouth daily. 01/25/17   Dustin Flock, MD  cholecalciferol (VITAMIN D) 1000 units tablet Take 2,000 Units by mouth daily.     [provider]  ferrous sulfate 324 MG TBEC Take 324 mg by mouth 3 (three) times a week.    [provider]  labetalol (NORMODYNE) 300 MG tablet Take 1 tablet (300 mg total) by mouth 2 (two) times daily. 01/24/17   Dustin Flock, MD  lisinopril (PRINIVIL,ZESTRIL) 40 MG tablet Take 1 tablet (40 mg total) by mouth daily. 01/25/17   Dustin Flock, MD  ondansetron (ZOFRAN) 4 MG tablet Take 1 tablet (4 mg total) by mouth every 8 (eight) hours as needed. Patient not taking: Reported on 12/15/2019 11/09/19   Nance Pear, MD    Allergies Patient has no known allergies.  Family History  Problem Relation Age of Onset  . Obesity Other     Social History Social History   Tobacco Use  . Smoking status: Never Smoker  . Smokeless tobacco: Never Used  Substance Use Topics  . Alcohol use: Yes  . Drug use: No    Review of Systems  Constitutional: No fever/chills Eyes: No visual changes. ENT: No sore throat. Cardiovascular: Positive for palpitations.  Denies chest pain. Respiratory: Denies shortness of breath. Gastrointestinal: No abdominal pain.  No nausea, no vomiting.  No diarrhea.  No constipation. Genitourinary: Negative for dysuria. Musculoskeletal: Negative  for back pain. Skin: Negative for rash. Neurological: Negative for headaches, focal weakness or numbness.   ____________________________________________   PHYSICAL EXAM:  VITAL SIGNS: ED Triage Vitals [12/05/20 0333]  Enc Vitals Group     BP (!) 183/96     Pulse Rate (!) 103     Resp 18     Temp 98.3 F (36.8 C)     Temp Source Oral     SpO2 96 %     Weight (!) 330 lb (149.7 kg)     Height '6\' 3"'$  (1.905 m)     Head Circumference      Peak Flow      Pain Score      Pain Loc      Pain Edu?      Excl. in [place]?     Constitutional: Alert and oriented. Well  appearing and in no acute distress. Eyes: Conjunctivae are normal. PERRL. EOMI. Head: Atraumatic. Nose: No congestion/rhinnorhea. Mouth/Throat: Mucous membranes are moist.   Neck: No stridor.  No thyromegaly. Cardiovascular: Normal rate, regular rhythm. Grossly normal heart sounds.  Good peripheral circulation. Respiratory: Normal respiratory effort.  No retractions. Lungs CTAB. Gastrointestinal: Obese.  Soft and nontender to light or deep palpation. No distention. No abdominal bruits. No CVA tenderness. Musculoskeletal: No lower extremity tenderness nor edema.  No joint effusions. Neurologic:  Normal speech and language. No gross focal neurologic deficits are appreciated. No gait instability. Skin:  Skin is warm, dry and intact. No rash noted. Psychiatric: Mood and affect are normal. Speech and behavior are normal.  ____________________________________________   LABS (all labs ordered are listed, but only abnormal results are displayed)  Labs Reviewed  BASIC METABOLIC PANEL - Abnormal; Notable for the following components:      Result Value   CO2 19 (*)    Glucose, Bld 124 (*)    BUN 49 (*)    Creatinine, Ser 5.56 (*)    GFR, Estimated 13 (*)    All other components within normal limits  CBC - Abnormal; Notable for the following components:   Hemoglobin 11.9 (*)    HCT 36.2 (*)    All other components within normal limits  PROTIME-INR  MAGNESIUM  TSH  T4, FREE  TROPONIN I (HIGH SENSITIVITY)  TROPONIN I (HIGH SENSITIVITY)   ____________________________________________  EKG  ED ECG REPORT I, Cordel Drewes J, the attending physician, personally viewed and interpreted this ECG.   Date: 12/05/2020  EKG Time: 0330  Rate: 103  Rhythm: sinus tachycardia  Axis: RAD  Intervals:none  ST&T Change: Nonspecific  ____________________________________________  RADIOLOGY Cecilie Kicks J, personally viewed and evaluated these images (plain radiographs) as part of my medical decision  making, as well as reviewing the written report by the radiologist.  ED MD interpretation: No acute cardiopulmonary process  Official radiology report(s): DG Chest 2 View  Result Date: 12/05/2020 CLINICAL DATA:  Palpitation EXAM: CHEST - 2 VIEW COMPARISON:  01/23/2017 FINDINGS: Normal heart size and mediastinal contours. No acute infiltrate or edema. No effusion or pneumothorax. No acute osseous findings. IMPRESSION: No active cardiopulmonary disease. Electronically Signed   By: Monte Fantasia M.D.   On: 12/05/2020 04:16    ____________________________________________   PROCEDURES  Procedure(s) performed (including Critical Care):  .1-3 Lead EKG Interpretation Performed by: Paulette Blanch, MD Authorized by: Paulette Blanch, MD     Interpretation: normal     ECG rate:  99   ECG rate assessment: normal     Rhythm: sinus  rhythm     Ectopy: none     Conduction: normal   Comments:     Patient placed on cardiac monitor to evaluate for arrhythmias     ____________________________________________   INITIAL IMPRESSION / ASSESSMENT AND PLAN / ED COURSE  As part of my medical decision making, I reviewed the following data within the [facility] notes reviewed and incorporated, Labs reviewed, EKG interpreted, Old chart reviewed, Radiograph reviewed and Notes from prior ED visits     40 year old male presenting with palpitations in the setting of switching from lisinopril to hydralazine with taking increased dose yesterday. Differential diagnosis includes, but is not limited to, ACS, aortic dissection, pulmonary embolism, cardiac tamponade, pneumothorax, pneumonia, pericarditis, myocarditis, GI-related causes including esophagitis/gastritis, and musculoskeletal chest wall pain.    Initial troponin and EKG unremarkable.  I personally reviewed patient's chart and see lab work from the [place] dated 11/26/2020.  Compared to those labs, patient's BUN and creatinine tonight are  mildly elevated.  Will infuse IV fluids, check thyroid panel, magnesium and reassess.  Clinical Course as of 12/05/20 [id number]  Sun Dec 05, 2020  0620 Patient resting in no acute distress.  Updated him of all test results.  Encouraged him to continue taking hydralazine 25 mg as directed by his doctor but increase his oral intake.  He may back down to 10 mg if palpitations persist.  Has an appointment with his PCP in 2 days.  Strict return precautions given.  Patient verbalizes understanding and agrees with plan of care. [JS]    Clinical Course User Index [JS] Paulette Blanch, MD     ____________________________________________   FINAL CLINICAL IMPRESSION(S) / ED DIAGNOSES  Final diagnoses:  Palpitations  Dehydration     ED Discharge Orders    None      *Please note:  Marc Neal was evaluated in Emergency Department on 12/05/2020 for the symptoms described in the history of present illness. He was evaluated in the context of the global COVID-19 pandemic, which necessitated consideration that the patient might be at risk for infection with the SARS-CoV-2 virus that causes COVID-19. Institutional protocols and algorithms that pertain to the evaluation of patients at risk for COVID-19 are in a state of rapid change based on information released by regulatory bodies including the CDC and federal and state organizations. These policies and algorithms were followed during the patient's care in the ED.  Some ED evaluations and interventions may be delayed as a result of limited staffing during and the pandemic.*   Note:  This document was prepared using Dragon voice recognition software and may include unintentional dictation errors.   Paulette Blanch, MD 12/05/20 [phone]

## 2020-12-05 NOTE — ED Notes (Signed)
Pt ambulatory to bathroom without difficulty.  

## 2020-12-05 NOTE — ED Triage Notes (Signed)
Pt was switched from lisinopril to hydralazine last wk to protect kidneys, started '10mg'$  last week, today was day #1 for '25mg'$ . States since 2000 having palpitations, denies pain. Reports hasnt eaten much today and is probably dehydrated

## 2020-12-05 NOTE — Discharge Instructions (Addendum)
Drink plenty of fluids daily.  Return to the ER for worsening symptoms, persistent vomiting, difficulty breathing or other concerns. °

## 2020-12-06 ENCOUNTER — Emergency Department
Admission: EM | Admit: 2020-12-06 | Discharge: 2020-12-06 | Disposition: A | Discharge status: Home / Self Care | Payer: No Typology Code available for payment source | Attending: Emergency Medicine | Admitting: Emergency Medicine

## 2020-12-06 ENCOUNTER — Other Ambulatory Visit: Payer: Self-pay

## 2020-12-06 DIAGNOSIS — R002 Palpitations: Secondary | ICD-10-CM | POA: Diagnosis not present

## 2020-12-06 DIAGNOSIS — U071 COVID-19: Secondary | ICD-10-CM | POA: Diagnosis not present

## 2020-12-06 DIAGNOSIS — I10 Essential (primary) hypertension: Secondary | ICD-10-CM | POA: Diagnosis not present

## 2020-12-06 DIAGNOSIS — Z79899 Other long term (current) drug therapy: Secondary | ICD-10-CM | POA: Diagnosis not present

## 2020-12-06 DIAGNOSIS — R519 Headache, unspecified: Secondary | ICD-10-CM | POA: Diagnosis present

## 2020-12-06 LAB — CBC WITH DIFFERENTIAL/PLATELET
Abs Immature Granulocytes: 0.01 10*3/uL (ref 0.00–0.07)
Basophils Absolute: 0 10*3/uL (ref 0.0–0.1)
Basophils Relative: 1 %
Eosinophils Absolute: 0.1 10*3/uL (ref 0.0–0.5)
Eosinophils Relative: 1 %
HCT: 38.8 % — ABNORMAL LOW (ref 39.0–52.0)
Hemoglobin: 13 g/dL (ref 13.0–17.0)
Immature Granulocytes: 0 %
Lymphocytes Relative: 25 %
Lymphs Abs: 1.4 10*3/uL (ref 0.7–4.0)
MCH: 28 pg (ref 26.0–34.0)
MCHC: 33.5 g/dL (ref 30.0–36.0)
MCV: 83.6 fL (ref 80.0–100.0)
Monocytes Absolute: 0.7 10*3/uL (ref 0.1–1.0)
Monocytes Relative: 12 %
Neutro Abs: 3.4 10*3/uL (ref 1.7–7.7)
Neutrophils Relative %: 61 %
Platelets: 200 10*3/uL (ref 150–400)
RBC: 4.64 MIL/uL (ref 4.22–5.81)
RDW: 12.8 % (ref 11.5–15.5)
WBC: 5.6 10*3/uL (ref 4.0–10.5)
nRBC: 0 % (ref 0.0–0.2)

## 2020-12-06 LAB — RESP PANEL BY RT-PCR (FLU A&B, COVID) ARPGX2
Influenza A by PCR: NEGATIVE
Influenza B by PCR: NEGATIVE
SARS Coronavirus 2 by RT PCR: POSITIVE — AB

## 2020-12-06 LAB — BASIC METABOLIC PANEL
Anion gap: 12 (ref 5–15)
BUN: 45 mg/dL — ABNORMAL HIGH (ref 6–20)
CO2: 19 mmol/L — ABNORMAL LOW (ref 22–32)
Calcium: 9.5 mg/dL (ref 8.9–10.3)
Chloride: 107 mmol/L (ref 98–111)
Creatinine, Ser: 5.09 mg/dL — ABNORMAL HIGH (ref 0.61–1.24)
GFR, Estimated: 14 mL/min — ABNORMAL LOW (ref >60)
Glucose, Bld: 106 mg/dL — ABNORMAL HIGH (ref 70–99)
Potassium: 4.1 mmol/L (ref 3.5–5.1)
Sodium: 138 mmol/L (ref 135–145)

## 2020-12-06 LAB — TROPONIN I (HIGH SENSITIVITY): Troponin I (High Sensitivity): 10 ng/L (ref <18)

## 2020-12-06 MED ORDER — SODIUM CHLORIDE 0.9 % IV BOLUS
1000.0000 mL | Freq: Once | INTRAVENOUS | Status: AC
  Administered 2020-12-06: 1000 mL via INTRAVENOUS

## 2020-12-06 MED ORDER — ONDANSETRON HCL 4 MG PO TABS: 4.0000 mg | ORAL_TABLET | Freq: Three times a day (TID) | ORAL | 0 refills | Status: DC | PRN

## 2020-12-06 MED ORDER — MOLNUPIRAVIR EUA 200MG CAPSULE: 4.0000 | ORAL_CAPSULE | Freq: Two times a day (BID) | ORAL | 0 refills | Status: AC

## 2020-12-06 NOTE — ED Triage Notes (Signed)
Pt in wth co palpitations, body aches, and vomiting since Saturday. Was here for the same Saturday and dx with dehydration.

## 2020-12-06 NOTE — ED Notes (Signed)
Dr. Archie Balboa notified of positive covid result.

## 2020-12-06 NOTE — ED Provider Notes (Signed)
[facility] Emergency Department Provider Note    ____________________________________________   I have reviewed the triage vital signs and the nursing notes.   HISTORY  Chief Complaint Palpitations   History limited by: Not Limited   HPI Marc Neal is a 40 y.o. male who presents to the emergency department today because of concern for continued feelings of palpitations, headache, body ache and decreased oral intake. Was seen in the emergency department yesterday for similar symptoms and states that he was told it was due to dehydration. Was given fluids. Since returning home he does not think that he has been keeping up with his hydration. He had felt hot and cold. Denies any known sick contacts however has gone back to the gym where he is exposed to a lot of different people.     Records reviewed. Per medical record review patient has a history of CKD, polycystic disease. ED visit yesterday.   Past Medical History:  Diagnosis Date  . Gout   . HTN (hypertension)   . Obesity   . Polycystic kidney     Patient Active Problem List   Diagnosis Date Noted  . Abdominal pain   . Enteritis   . Accelerated hypertension 01/23/2017  . AKI (acute kidney injury) ([place]) 01/23/2017  . Gout 01/23/2017  . SOB (shortness of breath) 01/23/2017    Past Surgical History:  Procedure Laterality Date  . AV FISTULA PLACEMENT Right   . KNEE SURGERY    . NO PAST SURGERIES      Prior to Admission medications   Medication Sig Start Date End Date Taking? Authorizing Provider  allopurinol (ZYLOPRIM) 100 MG tablet Take 100 mg by mouth daily.    [provider]  amLODipine (NORVASC) 10 MG tablet Take 1 tablet (10 mg total) by mouth daily. 01/25/17   Dustin Flock, MD  cholecalciferol (VITAMIN D) 1000 units tablet Take 2,000 Units by mouth daily.     [provider]  ferrous sulfate 324 MG TBEC Take 324 mg by mouth 3 (three) times a week.    [provider]  labetalol (NORMODYNE) 300 MG tablet Take 1 tablet (300 mg total) by mouth 2 (two) times daily. 01/24/17   Dustin Flock, MD  lisinopril (PRINIVIL,ZESTRIL) 40 MG tablet Take 1 tablet (40 mg total) by mouth daily. 01/25/17   Dustin Flock, MD  ondansetron (ZOFRAN) 4 MG tablet Take 1 tablet (4 mg total) by mouth every 8 (eight) hours as needed. Patient not taking: Reported on 12/15/2019 11/09/19   Nance Pear, MD    Allergies Patient has no known allergies.  Family History  Problem Relation Age of Onset  . Obesity Other     Social History Social History   Tobacco Use  . Smoking status: Never Smoker  . Smokeless tobacco: Never Used  Substance Use Topics  . Alcohol use: Yes  . Drug use: No    Review of Systems Constitutional: Has felt hot and cold. Eyes: No visual changes. ENT: No sore throat. Cardiovascular: Positive for palpitations.  Respiratory: Positive for shortness of breath. Gastrointestinal: Positive for decreased appetite.  Genitourinary: Negative for dysuria. Musculoskeletal: Positive for body aches.  Skin: Negative for rash. Neurological: Positive for headache. ____________________________________________   PHYSICAL EXAM:  VITAL SIGNS: ED Triage Vitals  Enc Vitals Group     BP 12/06/20 2024 130/80     Pulse Rate 12/06/20 2022 99     Resp 12/06/20 2022 19     Temp 12/06/20  2022 99 F (37.2 C)     Temp Source 12/06/20 2022 Oral     SpO2 12/06/20 2022 99 %     Weight 12/06/20 2010 (!) 330 lb (149.7 kg)     Height 12/06/20 2010 6' (1.829 m)     Head Circumference --      Peak Flow --      Pain Score 12/06/20 2010 6   Constitutional: Alert and oriented.  Eyes: Conjunctivae are normal.  ENT      Head: Normocephalic and atraumatic.      Nose: No congestion/rhinnorhea.      Mouth/Throat: Mucous membranes are moist.      Neck: No stridor. Hematological/Lymphatic/Immunilogical: No cervical lymphadenopathy. Cardiovascular: Normal rate,  regular rhythm.  No murmurs, rubs, or gallops.  Respiratory: Normal respiratory effort without tachypnea nor retractions. Breath sounds are clear and equal bilaterally. No wheezes/rales/rhonchi. Gastrointestinal: Soft and non tender. No rebound. No guarding.  Genitourinary: Deferred Musculoskeletal: Normal range of motion in all extremities. No lower extremity edema. Neurologic:  Normal speech and language. No gross focal neurologic deficits are appreciated.  Skin:  Skin is warm, dry and intact. No rash noted. Psychiatric: Mood and affect are normal. Speech and behavior are normal. Patient exhibits appropriate insight and judgment.  ____________________________________________    LABS (pertinent positives/negatives)  Trop hs 10 CBC wbc 5.6, hgb 13.0, plt 200 BMP na 138, k 4.1, glu 106, cr 5.09 COVID positive ____________________________________________   EKG  None  ____________________________________________    RADIOLOGY  None  ____________________________________________   PROCEDURES  Procedures  ____________________________________________   INITIAL IMPRESSION / ASSESSMENT AND PLAN / ED COURSE  Pertinent labs & imaging results that were available during my care of the patient were reviewed by me and considered in my medical decision making (see chart for details).   Patient presented to the emergency department today with signs and symptoms concerning for COVID.  He did test positive for COVID.  He did feel better after IV fluids.  Will plan on discharging with antiemetics as well as and thyroid medication.  Given history of kidney disease will place patient on molnupiravir.  ____________________________________________   FINAL CLINICAL IMPRESSION(S) / ED DIAGNOSES  Final diagnoses:  COVID-19     Note: This dictation was prepared with Dragon dictation. Any transcriptional errors that result from this process are unintentional     Nance Pear,  MD 12/06/20 2248

## 2020-12-06 NOTE — Discharge Instructions (Addendum)
Please seek medical attention for any high fevers, chest pain, shortness of breath, change in behavior, persistent vomiting, bloody stool or any other new or concerning symptoms.  

## 2020-12-13 ENCOUNTER — Encounter: Payer: Self-pay | Admitting: Gastroenterology

## 2020-12-21 DIAGNOSIS — I129 Hypertensive chronic kidney disease with stage 1 through stage 4 chronic kidney disease, or unspecified chronic kidney disease: Secondary | ICD-10-CM | POA: Insufficient documentation

## 2020-12-21 DIAGNOSIS — I770 Arteriovenous fistula, acquired: Secondary | ICD-10-CM | POA: Insufficient documentation

## 2020-12-23 ENCOUNTER — Other Ambulatory Visit (INDEPENDENT_AMBULATORY_CARE_PROVIDER_SITE_OTHER): Payer: Self-pay | Admitting: Vascular Surgery

## 2020-12-23 DIAGNOSIS — N186 End stage renal disease: Secondary | ICD-10-CM

## 2020-12-23 DIAGNOSIS — T829XXA Unspecified complication of cardiac and vascular prosthetic device, implant and graft, initial encounter: Secondary | ICD-10-CM

## 2020-12-24 ENCOUNTER — Other Ambulatory Visit: Payer: Self-pay

## 2020-12-24 ENCOUNTER — Ambulatory Visit (INDEPENDENT_AMBULATORY_CARE_PROVIDER_SITE_OTHER): Payer: No Typology Code available for payment source

## 2020-12-24 ENCOUNTER — Ambulatory Visit (INDEPENDENT_AMBULATORY_CARE_PROVIDER_SITE_OTHER): Payer: No Typology Code available for payment source | Admitting: Vascular Surgery

## 2020-12-24 ENCOUNTER — Encounter (INDEPENDENT_AMBULATORY_CARE_PROVIDER_SITE_OTHER): Payer: Self-pay | Admitting: Vascular Surgery

## 2020-12-24 VITALS — BP 122/79 | HR 78 | Ht 75.0 in | Wt 326.0 lb

## 2020-12-24 DIAGNOSIS — N184 Chronic kidney disease, stage 4 (severe): Secondary | ICD-10-CM

## 2020-12-24 DIAGNOSIS — M545 Low back pain, unspecified: Secondary | ICD-10-CM | POA: Insufficient documentation

## 2020-12-24 DIAGNOSIS — Z0120 Encounter for dental examination and cleaning without abnormal findings: Secondary | ICD-10-CM | POA: Insufficient documentation

## 2020-12-24 DIAGNOSIS — G4733 Obstructive sleep apnea (adult) (pediatric): Secondary | ICD-10-CM | POA: Insufficient documentation

## 2020-12-24 DIAGNOSIS — M10371 Gout due to renal impairment, right ankle and foot: Secondary | ICD-10-CM | POA: Insufficient documentation

## 2020-12-24 DIAGNOSIS — R310 Gross hematuria: Secondary | ICD-10-CM | POA: Insufficient documentation

## 2020-12-24 DIAGNOSIS — M2240 Chondromalacia patellae, unspecified knee: Secondary | ICD-10-CM | POA: Insufficient documentation

## 2020-12-24 DIAGNOSIS — F4322 Adjustment disorder with anxiety: Secondary | ICD-10-CM | POA: Insufficient documentation

## 2020-12-24 DIAGNOSIS — R519 Headache, unspecified: Secondary | ICD-10-CM | POA: Insufficient documentation

## 2020-12-24 DIAGNOSIS — R002 Palpitations: Secondary | ICD-10-CM | POA: Insufficient documentation

## 2020-12-24 DIAGNOSIS — F321 Major depressive disorder, single episode, moderate: Secondary | ICD-10-CM | POA: Insufficient documentation

## 2020-12-24 DIAGNOSIS — F4312 Post-traumatic stress disorder, chronic: Secondary | ICD-10-CM | POA: Insufficient documentation

## 2020-12-24 DIAGNOSIS — Z8042 Family history of malignant neoplasm of prostate: Secondary | ICD-10-CM | POA: Insufficient documentation

## 2020-12-24 DIAGNOSIS — Z7682 Awaiting organ transplant status: Secondary | ICD-10-CM | POA: Insufficient documentation

## 2020-12-24 DIAGNOSIS — I10 Essential (primary) hypertension: Secondary | ICD-10-CM | POA: Diagnosis not present

## 2020-12-24 DIAGNOSIS — M2140 Flat foot [pes planus] (acquired), unspecified foot: Secondary | ICD-10-CM | POA: Insufficient documentation

## 2020-12-24 DIAGNOSIS — T829XXA Unspecified complication of cardiac and vascular prosthetic device, implant and graft, initial encounter: Secondary | ICD-10-CM

## 2020-12-24 DIAGNOSIS — N186 End stage renal disease: Secondary | ICD-10-CM | POA: Diagnosis not present

## 2020-12-24 DIAGNOSIS — H40013 Open angle with borderline findings, low risk, bilateral: Secondary | ICD-10-CM | POA: Insufficient documentation

## 2020-12-24 DIAGNOSIS — M1710 Unilateral primary osteoarthritis, unspecified knee: Secondary | ICD-10-CM | POA: Insufficient documentation

## 2020-12-24 NOTE — Assessment & Plan Note (Signed)
Currently controlled and not bothersome

## 2020-12-24 NOTE — Assessment & Plan Note (Signed)
Likely an underlying cause of CKD and blood pressure control important in reducing the progression of atherosclerotic disease. On appropriate oral medications.

## 2020-12-24 NOTE — Progress Notes (Signed)
Patient ID: Marc Neal, male   DOB: February 25, 1981, 40 y.o.   MRN: [id number]  Chief Complaint  Patient presents with  . New Patient (Initial Visit)    New. HDA+ consult. ESRD. Non maturing fistula. [facility]    HPI Marc Neal is a 40 y.o. male.  I am asked to see the patient by theM. Knox Saliva and the [place] for evaluation of a nonmaturing fistula in the right radiocephalic location.  The patient is not yet on dialysis.  His last GFR was 19.  He has no volume overload, shortness of breath, severe swelling, or other uremic symptoms that would prompt immediate dialysis.  He had this placed back in March and has not yet been used for dialysis.  We did a duplex today which showed no obvious stenosis in the patent right radiocephalic AV fistula with reasonably good flow.     Past Medical History:  Diagnosis Date  . Gout   . HTN (hypertension)   . Obesity   . Polycystic kidney     Past Surgical History:  Procedure Laterality Date  . AV FISTULA PLACEMENT Right   . KNEE SURGERY    . NO PAST SURGERIES       Family History  Problem Relation Age of Onset  . Obesity Other   No bleeding disorders, clotting disorders, autoimmune diseases, or aneurysms   Social History   Tobacco Use  . Smoking status: Never Smoker  . Smokeless tobacco: Never Used  Substance Use Topics  . Alcohol use: Yes  . Drug use: No     No Known Allergies  Current Outpatient Medications  Medication Sig Dispense Refill  . allopurinol (ZYLOPRIM) 100 MG tablet Take 100 mg by mouth daily.    Marland Kitchen amLODipine (NORVASC) 10 MG tablet Take 1 tablet (10 mg total) by mouth daily. 30 tablet 0  . calcitRIOL (ROCALTROL) 0.25 MCG capsule Take by mouth.    . ferrous sulfate 324 MG TBEC Take 324 mg by mouth 3 (three) times a week.    . hydrALAZINE (APRESOLINE) 10 MG tablet Take 1 tablet by mouth 4 (four) times daily.    Marland Kitchen labetalol (NORMODYNE) 300 MG tablet Take 1 tablet (300 mg total) by mouth 2 (two) times daily.  60 tablet 0  . omeprazole (PRILOSEC) 40 MG capsule Take by mouth.    . cholecalciferol (VITAMIN D) 1000 units tablet Take 2,000 Units by mouth daily.  (Patient not taking: Reported on 12/24/2020)    . lisinopril (PRINIVIL,ZESTRIL) 40 MG tablet Take 1 tablet (40 mg total) by mouth daily. (Patient not taking: Reported on 12/24/2020) 30 tablet 0  . ondansetron (ZOFRAN) 4 MG tablet Take 1 tablet (4 mg total) by mouth every 8 (eight) hours as needed. (Patient not taking: No sig reported) 20 tablet 0  . ondansetron (ZOFRAN) 4 MG tablet Take 1 tablet (4 mg total) by mouth every 8 (eight) hours as needed. (Patient not taking: Reported on 12/24/2020) 20 tablet 0   No current facility-administered medications for this visit.      REVIEW OF SYSTEMS (Negative unless checked)  Constitutional: '[]'$ Weight loss  '[]'$ Fever  '[]'$ Chills Cardiac: '[]'$ Chest pain   '[]'$ Chest pressure   '[]'$ Palpitations   '[]'$ Shortness of breath when laying flat   '[]'$ Shortness of breath at rest   '[]'$ Shortness of breath with exertion. Vascular:  '[]'$ Pain in legs with walking   '[]'$ Pain in legs at rest   '[]'$ Pain in legs when laying flat   '[]'$ Claudication   '[]'$   Pain in feet when walking  '[]'$ Pain in feet at rest  '[]'$ Pain in feet when laying flat   '[]'$ History of DVT   '[]'$ Phlebitis   '[]'$ Swelling in legs   '[]'$ Varicose veins   '[]'$ Non-healing ulcers Pulmonary:   '[]'$ Uses home oxygen   '[]'$ Productive cough   '[]'$ Hemoptysis   '[]'$ Wheeze  '[]'$ COPD   '[]'$ Asthma Neurologic:  '[]'$ Dizziness  '[]'$ Blackouts   '[]'$ Seizures   '[]'$ History of stroke   '[]'$ History of TIA  '[]'$ Aphasia   '[]'$ Temporary blindness   '[]'$ Dysphagia   '[]'$ Weakness or numbness in arms   '[]'$ Weakness or numbness in legs Musculoskeletal:  '[x]'$ Arthritis   '[]'$ Joint swelling   '[]'$ Joint pain   '[]'$ Low back pain Hematologic:  '[]'$ Easy bruising  '[]'$ Easy bleeding   '[]'$ Hypercoagulable state   '[]'$ Anemic  '[]'$ Hepatitis Gastrointestinal:  '[]'$ Blood in stool   '[]'$ Vomiting blood  '[]'$ Gastroesophageal reflux/heartburn   '[]'$ Abdominal pain Genitourinary:  '[x]'$ Chronic kidney disease    '[]'$ Difficult urination  '[]'$ Frequent urination  '[]'$ Burning with urination   '[]'$ Hematuria Skin:  '[]'$ Rashes   '[]'$ Ulcers   '[]'$ Wounds Psychological:  '[]'$ History of anxiety   '[]'$  History of major depression.    Physical Exam BP 122/79   Pulse 78   Ht '6\' 3"'$  (1.905 m)   Wt (!) 326 lb (147.9 kg)   BMI 40.75 kg/m  Gen:  WD/WN, NAD Head: [place]/AT, No temporalis wasting.  Ear/Nose/Throat: Hearing grossly intact, nares w/o erythema or drainage, oropharynx w/o Erythema/Exudate Eyes: Conjunctiva clear, sclera non-icteric  Neck: trachea midline.  No JVD.  Pulmonary:  Good air movement, respirations not labored, no use of accessory muscles  Cardiac: RRR, no JVD Vascular: Right radiocephalic AV fistula with good thrill.  It is visible near the anastomosis and at the antecubital fossa but is not easily visible throughout the remainder of the forearm. Vessel Right Left  Radial Palpable Palpable                                   Gastrointestinal:. No masses, surgical incisions, or scars. Musculoskeletal: M/S 5/5 throughout.  Extremities without ischemic changes.  No deformity or atrophy.  No lower extremity edema. Neurologic: Sensation grossly intact in extremities.  Symmetrical.  Speech is fluent. Motor exam as listed above. Psychiatric: Judgment intact, Mood & affect appropriate for pt's clinical situation. Dermatologic: No rashes or ulcers noted.  No cellulitis or open wounds.    Radiology DG Chest 2 View  Result Date: 12/05/2020 CLINICAL DATA:  Palpitation EXAM: CHEST - 2 VIEW COMPARISON:  01/23/2017 FINDINGS: Normal heart size and mediastinal contours. No acute infiltrate or edema. No effusion or pneumothorax. No acute osseous findings. IMPRESSION: No active cardiopulmonary disease. Electronically Signed   By: Monte Fantasia M.D.   On: 12/05/2020 04:16   VAS US DUPLEX DIALYSIS ACCESS (AVF,AVG)  Result Date: 12/24/2020 DIALYSIS ACCESS Patient Name:  Marc Neal  Date of Exam:   12/24/2020 Medical  Rec #: [id number]       Accession #:    [id number] Date of Birth: May 20, 1981       Patient Gender: M Patient Age:   73Y Exam Location:  [facility] Procedure:      VAS US DUPLEX DIALYSIS ACCESS (AVF, AVG) Referring Phys: [id number] [place] --------------------------------------------------------------------------------  Reason for Exam: Routine follow up. Access Site: Right Upper Extremity. Access Type: Radial-cephalic AVF. History: Created 09/2020. Performing Technologist: Concha Norway RVT  Examination Guidelines: A complete evaluation includes B-mode imaging, spectral Doppler,  color Doppler, and power Doppler as needed of all accessible portions of each vessel. Unilateral testing is considered an integral part of a complete examination. Limited examinations for reoccurring indications may be performed as noted.  Findings: +--------------------+----------+-----------------+--------+ AVF                 PSV (cm/s)Flow Vol (mL/min)Comments +--------------------+----------+-----------------+--------+ Native artery inflow   179           727                +--------------------+----------+-----------------+--------+ AVF Anastomosis        342                              +--------------------+----------+-----------------+--------+  +------------+----------+-------------+----------+--------+ OUTFLOW VEINPSV (cm/s)Diameter (cm)Depth (cm)Describe +------------+----------+-------------+----------+--------+ Mid UA         140                                    +------------+----------+-------------+----------+--------+ Dist UA         94                                    +------------+----------+-------------+----------+--------+ Prox Forearm   110                                    +------------+----------+-------------+----------+--------+ Mid Forearm    144                                    +------------+----------+-------------+----------+--------+ Dist Forearm    294                                    +------------+----------+-------------+----------+--------+  +-----------------+-------------+---------+---------+----------+---------------+                  Diameter (cm)  Depth  BranchingPSV (cm/s)  Flow Volume                                   (cm)                         (ml/min)     +-----------------+-------------+---------+---------+----------+---------------+ Rt Rad Art pre                                                            anast                                                                     +-----------------+-------------+---------+---------+----------+---------------+ antegrade  133                    +-----------------+-------------+---------+---------+----------+---------------+ Rt Rad Art post                                                           anast                                                                     +-----------------+-------------+---------+---------+----------+---------------+ Retrograde                                         127                    +-----------------+-------------+---------+---------+----------+---------------+  Summary: Patent right radial cephalic AVF with no evidence stenosis or other abnormalities.  *See table(s) above for measurements and observations.  Diagnosing physician: Leotis Pain MD Electronically signed by Leotis Pain MD on 12/24/2020 at 10:55:23 AM.   --------------------------------------------------------------------------------   Final     Labs Recent Results (from the past 2160 hour(s))  Basic metabolic panel     Status: Abnormal   Collection Time: 12/05/20  3:39 AM  Result Value Ref Range   Sodium 138 135 - 145 mmol/L   Potassium 4.3 3.5 - 5.1 mmol/L   Chloride 108 98 - 111 mmol/L   CO2 19 (L) 22 - 32 mmol/L   Glucose, Bld 124 (H) 70 - 99 mg/dL    Comment: Glucose reference range  applies only to samples taken after fasting for at least 8 hours.   BUN 49 (H) 6 - 20 mg/dL   Creatinine, Ser 5.56 (H) 0.61 - 1.24 mg/dL   Calcium 9.3 8.9 - 10.3 mg/dL   GFR, Estimated 13 (L) >60 mL/min    Comment: (NOTE) Calculated using the CKD-EPI Creatinine Equation (2021)    Anion gap 11 5 - 15    Comment: Performed at [facility], [address] [place], [place] [address]  CBC     Status: Abnormal   Collection Time: 12/05/20  3:39 AM  Result Value Ref Range   WBC 5.1 4.0 - 10.5 K/uL   RBC 4.24 4.22 - 5.81 MIL/uL   Hemoglobin 11.9 (L) 13.0 - 17.0 g/dL   HCT 36.2 (L) 39.0 - 52.0 %   MCV 85.4 80.0 - 100.0 fL   MCH 28.1 26.0 - 34.0 pg   MCHC 32.9 30.0 - 36.0 g/dL   RDW 12.9 11.5 - 15.5 %   Platelets 160 150 - 400 K/uL   nRBC 0.0 0.0 - 0.2 %    Comment: Performed at [facility], [place], [place] [address]  Troponin I (High Sensitivity)     Status: None   Collection Time: 12/05/20  3:39 AM  Result Value Ref Range   Troponin I (High Sensitivity) 8 <18 ng/L    Comment: (NOTE) Elevated high sensitivity troponin I (hsTnI) values and significant  changes across serial  measurements may suggest ACS but many other  chronic and acute conditions are known to elevate hsTnI results.  Refer to the "Links" section for chest pain algorithms and additional  guidance. Performed at [facility], [address] [place], [place] [address]   Protime-INR (order if Patient is taking Coumadin / Warfarin)     Status: None   Collection Time: 12/05/20  3:39 AM  Result Value Ref Range   Prothrombin Time 13.7 11.4 - 15.2 seconds   INR 1.1 0.8 - 1.2    Comment: (NOTE) INR goal varies based on device and disease states. Performed at [facility], [address] [place], [place] [address]   Magnesium     Status: None   Collection Time: 12/05/20  3:39 AM  Result Value Ref Range   Magnesium 1.8 1.7 - 2.4 mg/dL    Comment: Performed at  [facility], [address] [place], [place] [address]  TSH     Status: None   Collection Time: 12/05/20  3:39 AM  Result Value Ref Range   TSH 0.578 0.350 - 4.500 uIU/mL    Comment: Performed by a 3rd Generation assay with a functional sensitivity of <=0.01 uIU/mL. Performed at [facility], [address] [place], [place] [address]   T4, free     Status: None   Collection Time: 12/05/20  3:39 AM  Result Value Ref Range   Free T4 0.88 0.61 - 1.12 ng/dL    Comment: (NOTE) Biotin ingestion may interfere with free T4 tests. If the results are inconsistent with the TSH level, previous test results, or the clinical presentation, then consider biotin interference. If needed, order repeat testing after stopping biotin. Performed at [facility], [address], [place] [address]   Troponin I (High Sensitivity)     Status: None   Collection Time: 12/05/20  5:35 AM  Result Value Ref Range   Troponin I (High Sensitivity) 7 <18 ng/L    Comment: (NOTE) Elevated high sensitivity troponin I (hsTnI) values and significant  changes across serial measurements may suggest ACS but many other  chronic and acute conditions are known to elevate hsTnI results.  Refer to the "Links" section for chest pain algorithms and additional  guidance. Performed at [facility], [address] [place], [place] [address]   CBC with Differential     Status: Abnormal   Collection Time: 12/06/20  8:22 PM  Result Value Ref Range   WBC 5.6 4.0 - 10.5 K/uL   RBC 4.64 4.22 - 5.81 MIL/uL   Hemoglobin 13.0 13.0 - 17.0 g/dL   HCT 38.8 (L) 39.0 - 52.0 %   MCV 83.6 80.0 - 100.0 fL   MCH 28.0 26.0 - 34.0 pg   MCHC 33.5 30.0 - 36.0 g/dL   RDW 12.8 11.5 - 15.5 %   Platelets 200 150 - 400 K/uL   nRBC 0.0 0.0 - 0.2 %   Neutrophils Relative % 61 %   Neutro Abs 3.4 1.7 - 7.7 K/uL   Lymphocytes Relative 25 %   Lymphs Abs 1.4 0.7 - 4.0 K/uL   Monocytes Relative 12 %    Monocytes Absolute 0.7 0.1 - 1.0 K/uL   Eosinophils Relative 1 %   Eosinophils Absolute 0.1 0.0 - 0.5 K/uL   Basophils Relative 1 %   Basophils Absolute 0.0 0.0 - 0.1 K/uL   Immature Granulocytes 0 %   Abs Immature Granulocytes 0.01 0.00 - 0.07 K/uL  Comment: Performed at [facility], [address] [place], [place] [id number]  Basic metabolic panel     Status: Abnormal   Collection Time: 12/06/20  8:22 PM  Result Value Ref Range   Sodium 138 135 - 145 mmol/L   Potassium 4.1 3.5 - 5.1 mmol/L   Chloride 107 98 - 111 mmol/L   CO2 19 (L) 22 - 32 mmol/L   Glucose, Bld 106 (H) 70 - 99 mg/dL    Comment: Glucose reference range applies only to samples taken after fasting for at least 8 hours.   BUN 45 (H) 6 - 20 mg/dL   Creatinine, Ser 5.09 (H) 0.61 - 1.24 mg/dL   Calcium 9.5 8.9 - 10.3 mg/dL   GFR, Estimated 14 (L) >60 mL/min    Comment: (NOTE) Calculated using the CKD-EPI Creatinine Equation (2021)    Anion gap 12 5 - 15    Comment: Performed at [facility], [place], [place] [address]  Troponin I (High Sensitivity)     Status: None   Collection Time: 12/06/20  8:22 PM  Result Value Ref Range   Troponin I (High Sensitivity) 10 <18 ng/L    Comment: (NOTE) Elevated high sensitivity troponin I (hsTnI) values and significant  changes across serial measurements may suggest ACS but many other  chronic and acute conditions are known to elevate hsTnI results.  Refer to the "Links" section for chest pain algorithms and additional  guidance. Performed at [facility], [address] [place],  [address]   Resp Panel by RT-PCR (Flu A&B, Covid) Nasopharyngeal Swab     Status: Abnormal   Collection Time: 12/06/20  8:22 PM   Specimen: Nasopharyngeal Swab; Nasopharyngeal(NP) swabs in vial transport medium  Result Value Ref Range   SARS Coronavirus 2 by RT PCR POSITIVE (A) NEGATIVE    Comment: RESULT CALLED TO, READ BACK BY AND VERIFIED  WITH: EMILY GORMAN AT 2225 ON 12/06/20 BY SS (NOTE) SARS-CoV-2 target nucleic acids are DETECTED.  The SARS-CoV-2 RNA is generally detectable in upper respiratory specimens during the acute phase of infection. Positive results are indicative of the presence of the identified virus, but do not rule out bacterial infection or co-infection with other pathogens not detected by the test. Clinical correlation with patient history and other diagnostic information is necessary to determine patient infection status. The expected result is Negative.  Fact Sheet for Patients: EntrepreneurPulse.com.au  Fact Sheet for Healthcare Providers: IncredibleEmployment.be  This test is not yet approved or cleared by the [place] FDA and  has been authorized for detection and/or diagnosis of SARS-CoV-2 by FDA under an Emergency Use Authorization (EUA).  This EUA will remain in effect (meaning this test ca n be used) for the duration of  the COVID-19 declaration under Section 564(b)(1) of the Act, 21 U.S.C. section 360bbb-3(b)(1), unless the authorization is terminated or revoked sooner.     Influenza A by PCR NEGATIVE NEGATIVE   Influenza B by PCR NEGATIVE NEGATIVE    Comment: (NOTE) The Xpert Xpress SARS-CoV-2/FLU/RSV plus assay is intended as an aid in the diagnosis of influenza from Nasopharyngeal swab specimens and should not be used as a sole basis for treatment. Nasal washings and aspirates are unacceptable for Xpert Xpress SARS-CoV-2/FLU/RSV testing.  Fact Sheet for Patients: EntrepreneurPulse.com.au  Fact Sheet for Healthcare Providers: IncredibleEmployment.be  This test is not yet approved or cleared by the [place] FDA and has been authorized for detection and/or diagnosis of SARS-CoV-2  by FDA under an Emergency Use Authorization (EUA). This EUA will remain in effect (meaning this test can be used) for the  duration of the COVID-19 declaration under Section 564(b)(1) of the Act, 21 U.S.C. section 360bbb-3(b)(1), unless the authorization is terminated or revoked.  Performed at [facility], [address] [place], [place] [address]     Assessment/Plan:  Chronic kidney disease, stage IV (severe) ([place]) Patient has severe renal dysfunction but is not yet requiring dialysis. We did a duplex today which showed no obvious stenosis in the patent right radiocephalic AV fistula with reasonably good flow.  If he needed immediate dialysis, I would recommend a fistulogram with likely balloon assisted maturation to speed up the maturing process.  Since he is stable and is likely not to require dialysis in the next 2 to 3 months, I would just plan on following this and repeating a duplex with a follow-up visit in about 3 months.  If he needs immediate dialysis prior to that, please contact me and we can reassess this for being used.  The patient voices understanding and is agreeable with our plan of care.  Essential (primary) hypertension Likely an underlying cause of CKD and blood pressure control important in reducing the progression of atherosclerotic disease. On appropriate oral medications.   Gout due to renal impairment, right ankle and foot Currently controlled and not bothersome      Leotis Pain 12/24/2020, 12:10 PM   This note was created with Dragon medical transcription system.  Any errors from dictation are unintentional.

## 2020-12-24 NOTE — Patient Instructions (Signed)
Stoelting's Anesthesia and Co-Existing Disease (7th ed., pp. [id number]). Elsevier."> Signa Kell and Rector's The Kidney (10th ed., pp. 2191-2225.e6). Elsevier.">  Dialysis Vascular Access Malfunction        Dialysis vascular access is an opening into your blood vessels that can be used for dialysis treatments. Dialysis is a treatment used for kidney failure. A health care provider can make vascular access in many ways, such as by:  Joining an artery to a vein in order to make a bigger blood vessel called a fistula.  Joining an artery to a vein using a soft tube called a graft.  Placing a thin, flexible tube (catheter) in a large vein, usually in your neck, chest, or groin. A vascular access can malfunction. This means that it can become blocked or stop working correctly. What are the causes? Malfunctioning of a vascular access may be caused by:  Infection. This is the most common cause of malfunction.  A blood clot inside a part of the fistula, graft, or catheter. A blood clot can completely or partially block the flow of blood.  A kink in the graft or catheter.  A collection of blood (hematoma or bruise) next to the graft or catheter that pushes against it, blocking the flow of blood. What are the signs or symptoms? Symptoms of this condition include:  A change in the vibration or pulse (thrill) of your fistula or graft.  Feeling no thrill in the fistula or graft.  New or unusual swelling of the area around the access.  An unsuccessful puncture of your access by the dialysis team.  Slow flow of blood through the fistula, graft, or catheter. Dialysis will not work properly if this happens.  Bleeding that cannot be easily stopped when the needle is removed after dialysis.  Signs of infection, such as: ? Pain, swelling, redness, red streaks, or numbness. ? Blood or pus coming from the access. How is this diagnosed? This condition may be diagnosed with:  Blood tests or blood  cultures.  An X-ray that uses dye, or contrast, to show areas that have a blockage. How is this treated? Treatment for this condition depends on the underlying cause of the malfunction.  If the vascular access is infected, your health care provider may prescribe antibiotic medicine to control the infection.  If a clot is found in the vascular access, you may need surgery to remove the clot. Blood thinners may also be used.  If a blockage in the vascular access is caused by something else, such as a kink in a graft, you will likely need surgery to unblock or replace the graft.  If there is a malfunction for any reason, your health care provider may remove and replace your access. Follow these instructions at home: Medicines  Take over-the-counter and prescription medicines only as told by your health care provider.  If you were prescribed an antibiotic medicine, use it as told by your health care provider. Do not stop using the antibiotic even if you start to feel better. Activity  Try not to bump the area that has the access device.  Do not lift anything that is heavier than 10 lb (4.5 kg), or the limit that you are told, until your health care provider says that it is safe. Lifting heavy objects can put too much pressure on your access.  Do exercises as told by your health care provider. Certain exercises may be limited based on where the access site is located.  Return to your normal  activities as told by your health care provider. Ask your health care provider what activities are safe for you. Lifestyle  Do not wear jewelry or tight clothing around the access.  Do not sleep with the access arm placed under your head or body.  Avoid pulling or tugging on the access if you have a central line catheter. General instructions  Wash your hands with soap and water for at least 20 seconds before and after touching the area around the vascular access. If soap and water are not  available, use hand sanitizer.  Keep all follow-up visits. This is very important. Any delay in follow-up could cause permanent malfunction of the vascular access. Contact a health care provider if:  Swelling around the vascular access gets worse.  You develop new pain.  You have pus or other fluid (drainage) at the vascular access site.  You have chills or a fever. Get help right away if:  You have bleeding at the vascular access that cannot be easily controlled.  You have pain, numbness, an unusual pale skin color, or blue fingers or sores at the tips of the fingers of the fistula hand.  You develop redness or red streaking on the skin around, above, or below the vascular access.  Your access is hot, swollen, red, and very painful.  You can suddenly see the cuff of the catheter used in the access. These symptoms may represent a serious problem that is an emergency. Do not wait to see if the symptoms will go away. Get medical help right away. Call your local emergency services (911 in the [place] Do not drive yourself to the hospital. Summary  Dialysis vascular access is an opening into your blood vessels that can be used for dialysis treatments.  Your vascular access can become blocked or stop working correctly (malfunction) for several reasons.  Treatment varies depending on the cause of the malfunction.  Keep all follow-up visits. Any delay in follow-up could cause permanent malfunction of the vascular access. This information is not intended to replace advice given to you by your health care provider. Make sure you discuss any questions you have with your health care provider. Document Revised: 02/18/2020 Document Reviewed: 02/18/2020 Elsevier Patient Education  [place].

## 2020-12-24 NOTE — Assessment & Plan Note (Signed)
Patient has severe renal dysfunction but is not yet requiring dialysis. We did a duplex today which showed no obvious stenosis in the patent right radiocephalic AV fistula with reasonably good flow.  If he needed immediate dialysis, I would recommend a fistulogram with likely balloon assisted maturation to speed up the maturing process.  Since he is stable and is likely not to require dialysis in the next 2 to 3 months, I would just plan on following this and repeating a duplex with a follow-up visit in about 3 months.  If he needs immediate dialysis prior to that, please contact me and we can reassess this for being used.  The patient voices understanding and is agreeable with our plan of care.

## 2021-03-25 ENCOUNTER — Ambulatory Visit (INDEPENDENT_AMBULATORY_CARE_PROVIDER_SITE_OTHER): Payer: No Typology Code available for payment source | Admitting: Nurse Practitioner

## 2021-03-25 ENCOUNTER — Ambulatory Visit (INDEPENDENT_AMBULATORY_CARE_PROVIDER_SITE_OTHER): Payer: No Typology Code available for payment source | Admitting: Vascular Surgery

## 2021-03-25 ENCOUNTER — Other Ambulatory Visit: Payer: Self-pay

## 2021-03-25 ENCOUNTER — Ambulatory Visit (INDEPENDENT_AMBULATORY_CARE_PROVIDER_SITE_OTHER): Payer: No Typology Code available for payment source

## 2021-03-25 DIAGNOSIS — N184 Chronic kidney disease, stage 4 (severe): Secondary | ICD-10-CM

## 2021-04-06 ENCOUNTER — Encounter (INDEPENDENT_AMBULATORY_CARE_PROVIDER_SITE_OTHER): Payer: Self-pay | Admitting: *Deleted

## 2021-04-07 IMAGING — CT CT ABD-PELV W/O CM
2 of 4 series · 16 of 46 positions shown, 18 images · non-contrast
Comparison: None.

CLINICAL DATA: Acute right upper quadrant abdominal pain.

EXAM:
CT ABDOMEN AND PELVIS WITHOUT CONTRAST
TECHNIQUE: Multidetector CT imaging of the abdomen and pelvis was performed
following the standard protocol without IV contrast.

[Series 2: routine abd/pel wo · axial · 0.98mm/px · z∈[-595,-70]mm · 13 of 115 slices shown, 15 images]
[im 5/115  soft-tissue]
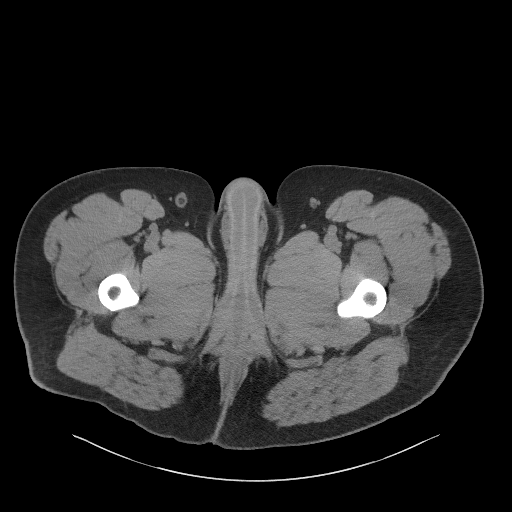
[im 5/115  bone]
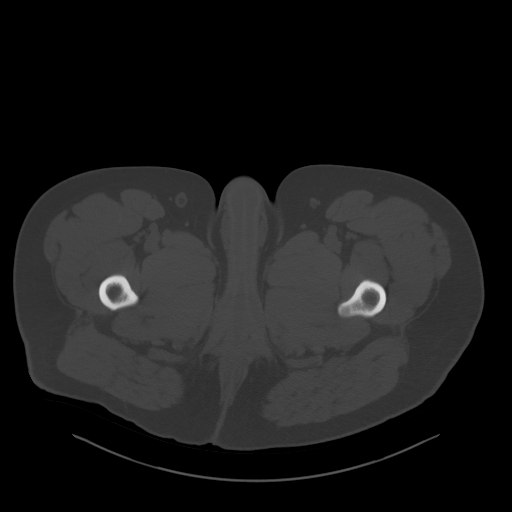
[im 14/115  soft-tissue]
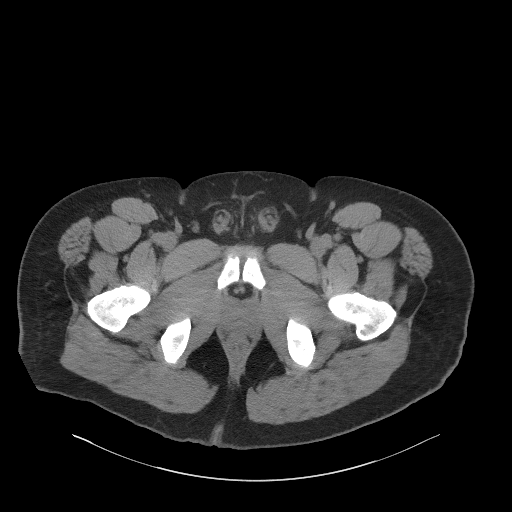
[im 23/115  soft-tissue]
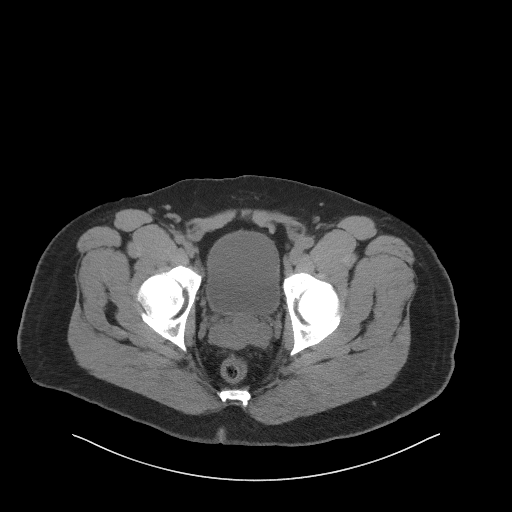
[im 32/115  soft-tissue]
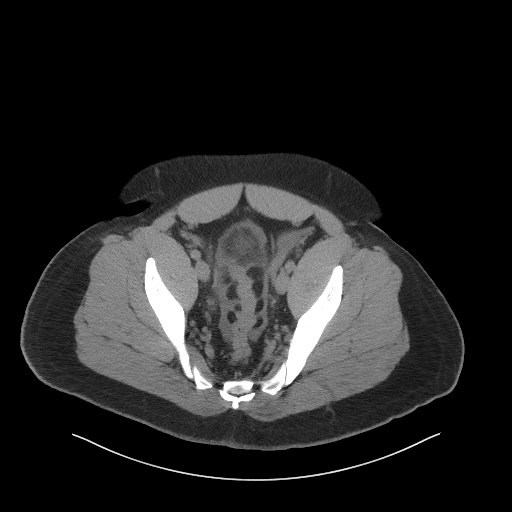
[im 42/115  soft-tissue]
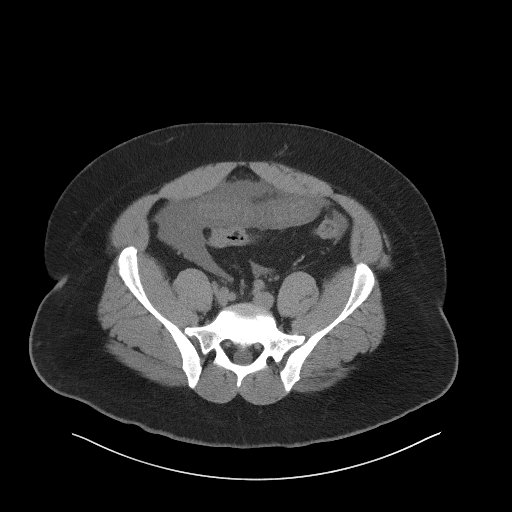
[im 51/115  soft-tissue]
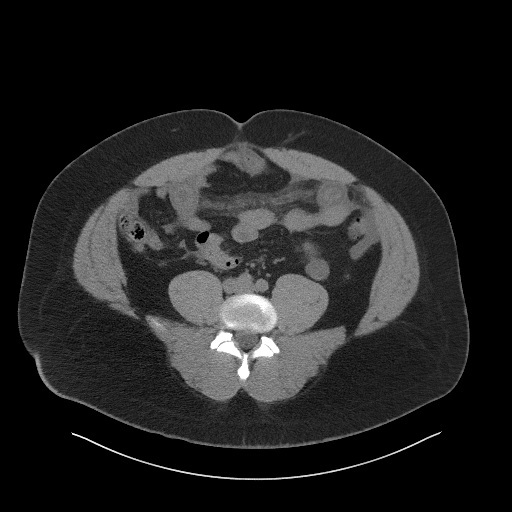
[im 60/115  soft-tissue]
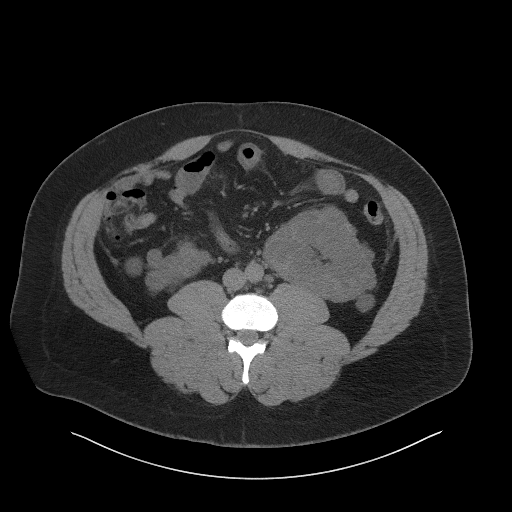
[im 64/115  soft-tissue]
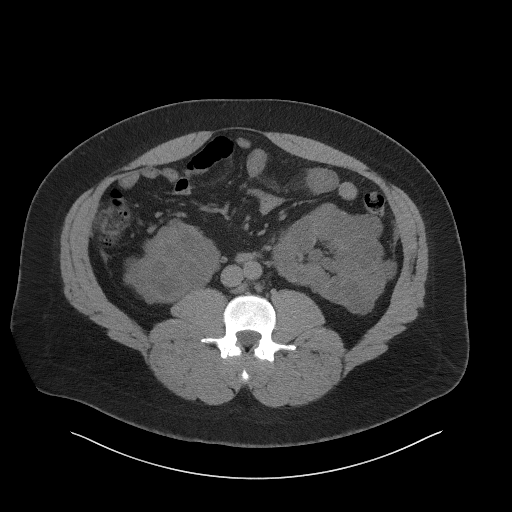
[im 73/115  soft-tissue]
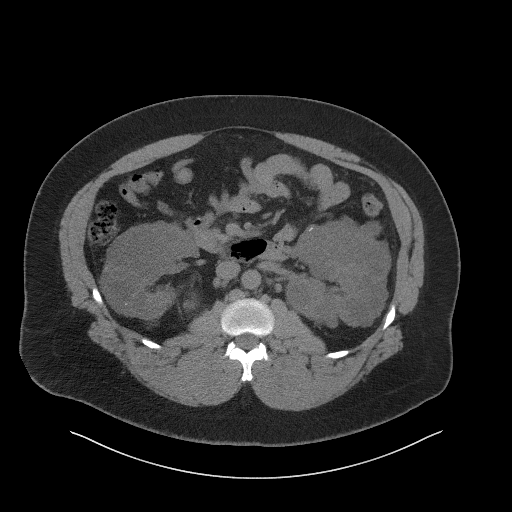
[im 73/115  bone]
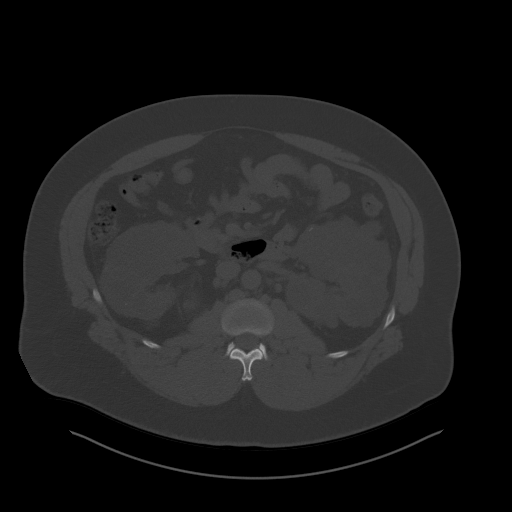
[im 83/115  soft-tissue]
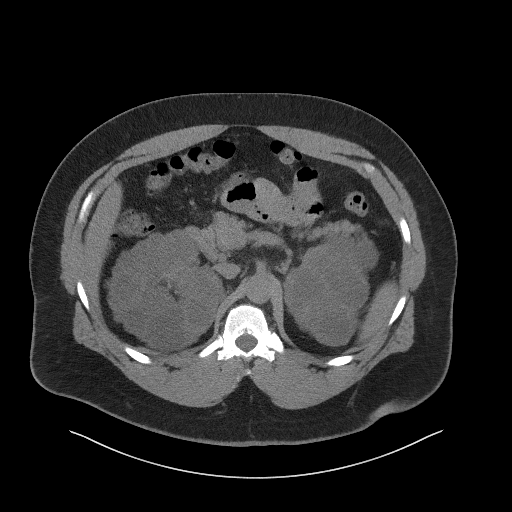
[im 92/115  soft-tissue]
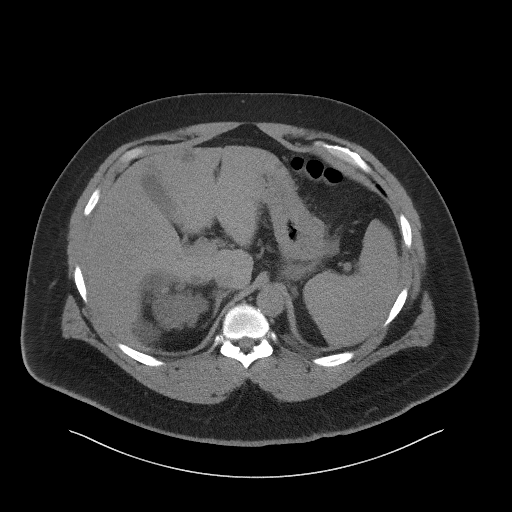
[im 101/115  soft-tissue]
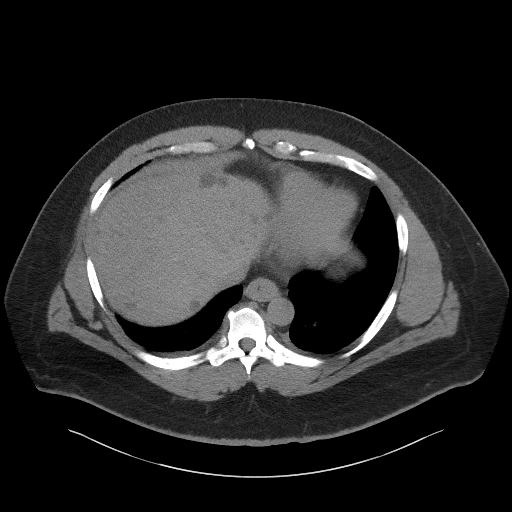
[im 110/115  soft-tissue]
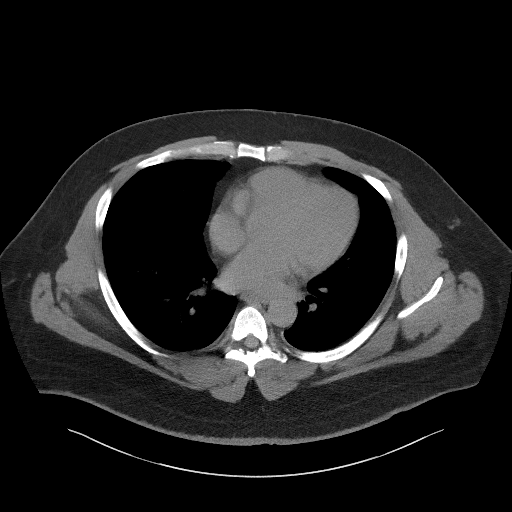

[Series 5: coronal st · coronal · 0.84mm/px · 3 of 111 slices shown]
[im 37/111  soft-tissue]
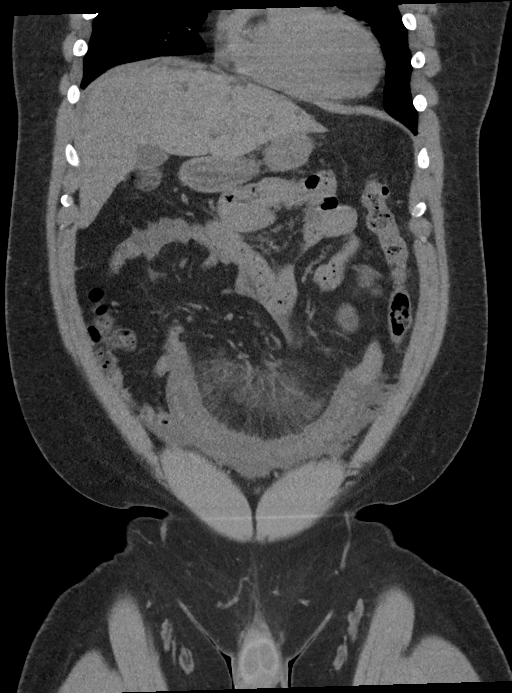
[im 49/111  soft-tissue]
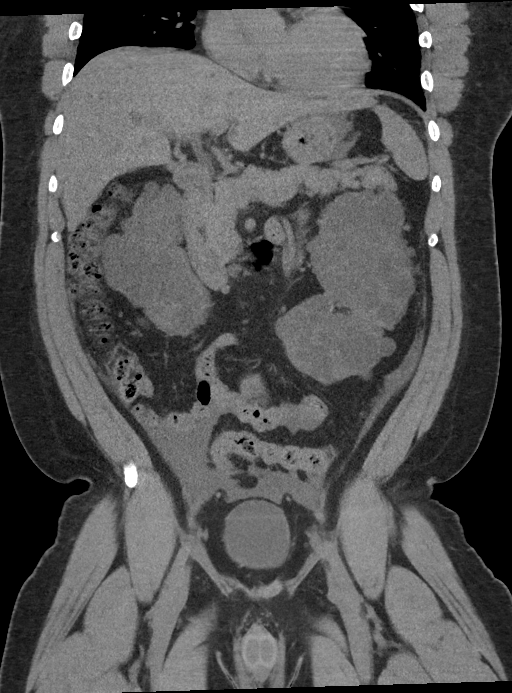
[im 62/111  soft-tissue]
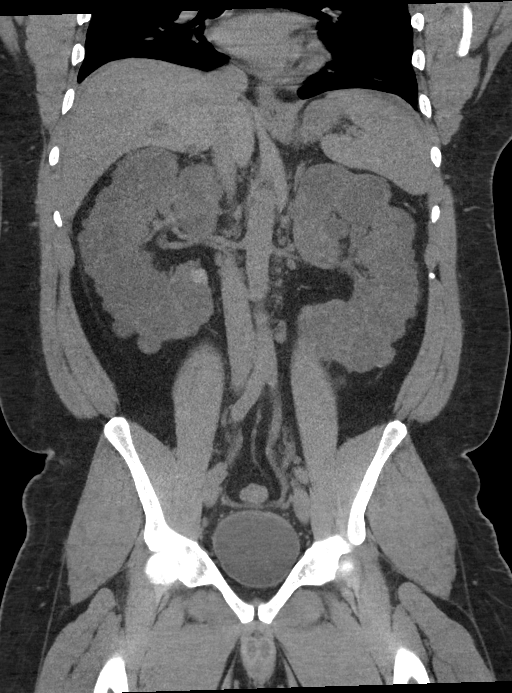

[16 of 46 positions shown; findings below may reference images not displayed]

FINDINGS: Lower chest: No acute abnormality.

Hepatobiliary: No gallstones or biliary dilatation is noted.
Multiple hepatic cysts are noted.

Pancreas: Unremarkable. No pancreatic ductal dilatation or
surrounding inflammatory changes.

Spleen: Normal in size without focal abnormality.

Adrenals/Urinary Tract: Adrenal glands appear normal. Innumerable
cysts are seen involving both kidneys consistent with polycystic
kidney disease. No hydronephrosis or renal obstruction is noted. No
renal or ureteral calculi are noted. Urinary bladder is
unremarkable.

Stomach/Bowel: The stomach and appendix are unremarkable. There is a
loop of small bowel in the central abdomen anteriorly with severe
wall thickening and surrounding inflammation consistent with severe
enteritis or possibly closed loop obstruction such as volvulus.

Vascular/Lymphatic: No significant vascular findings are present. No
enlarged abdominal or pelvic lymph nodes.

Reproductive: Prostate is unremarkable.

Other: Small fat containing periumbilical hernia is noted. Free
fluid is noted in the pelvis suggesting inflammation.

Musculoskeletal: No acute or significant osseous findings.
IMPRESSION: 1. There is a loop of small bowel in the central abdomen with severe
wall thickening and surrounding inflammation consistent with severe
enteritis or possibly closed loop obstruction such as volvulus. Mild
amount of free fluid is noted in the pelvis.
2. Findings consistent with polycystic kidney disease, with multiple
hepatic cysts present as well.

## 2022-03-31 ENCOUNTER — Ambulatory Visit (INDEPENDENT_AMBULATORY_CARE_PROVIDER_SITE_OTHER): Payer: No Typology Code available for payment source | Admitting: Vascular Surgery

## 2022-03-31 ENCOUNTER — Encounter (INDEPENDENT_AMBULATORY_CARE_PROVIDER_SITE_OTHER): Payer: No Typology Code available for payment source

## 2022-05-22 ENCOUNTER — Other Ambulatory Visit (INDEPENDENT_AMBULATORY_CARE_PROVIDER_SITE_OTHER): Payer: Self-pay | Admitting: Vascular Surgery

## 2022-05-22 ENCOUNTER — Encounter (INDEPENDENT_AMBULATORY_CARE_PROVIDER_SITE_OTHER): Payer: Self-pay

## 2022-05-22 DIAGNOSIS — N186 End stage renal disease: Secondary | ICD-10-CM

## 2022-05-26 ENCOUNTER — Ambulatory Visit (INDEPENDENT_AMBULATORY_CARE_PROVIDER_SITE_OTHER): Payer: No Typology Code available for payment source

## 2022-05-26 ENCOUNTER — Encounter (INDEPENDENT_AMBULATORY_CARE_PROVIDER_SITE_OTHER): Payer: Self-pay | Admitting: Vascular Surgery

## 2022-05-26 ENCOUNTER — Ambulatory Visit (INDEPENDENT_AMBULATORY_CARE_PROVIDER_SITE_OTHER): Payer: No Typology Code available for payment source | Admitting: Vascular Surgery

## 2022-05-26 VITALS — BP 112/71 | HR 74 | Resp 16 | Wt 277.2 lb

## 2022-05-26 DIAGNOSIS — I10 Essential (primary) hypertension: Secondary | ICD-10-CM | POA: Diagnosis not present

## 2022-05-26 DIAGNOSIS — N185 Chronic kidney disease, stage 5: Secondary | ICD-10-CM | POA: Diagnosis not present

## 2022-05-26 DIAGNOSIS — N186 End stage renal disease: Secondary | ICD-10-CM | POA: Diagnosis not present

## 2022-05-26 NOTE — Progress Notes (Signed)
MRN : [id number]  Marc Neal is a 41 y.o. (July 09, 1981) male who presents with chief complaint of  Chief Complaint  Patient presents with   Follow-up    Ultrasound follow up  .  History of Present Illness: Patient returns today in follow up of his right radiocephalic AVF.  He is doing well.  His GFR is down to 9 but he is not yet on dialysis. No complaints.  Right radiocephalic AVF is widely patent on duplex today.  Current Outpatient Medications  Medication Sig Dispense Refill   allopurinol (ZYLOPRIM) 100 MG tablet Take 100 mg by mouth daily.     amLODipine (NORVASC) 10 MG tablet Take 1 tablet (10 mg total) by mouth daily. 30 tablet 0   calcitRIOL (ROCALTROL) 0.25 MCG capsule Take by mouth.     ferrous sulfate 324 MG TBEC Take 324 mg by mouth 3 (three) times a week.     hydrALAZINE (APRESOLINE) 10 MG tablet Take 1 tablet by mouth 4 (four) times daily.     labetalol (NORMODYNE) 300 MG tablet Take 1 tablet (300 mg total) by mouth 2 (two) times daily. 60 tablet 0   omeprazole (PRILOSEC) 40 MG capsule Take by mouth.     cholecalciferol (VITAMIN D) 1000 units tablet Take 2,000 Units by mouth daily.  (Patient not taking: Reported on 12/24/2020)     lisinopril (PRINIVIL,ZESTRIL) 40 MG tablet Take 1 tablet (40 mg total) by mouth daily. (Patient not taking: Reported on 12/24/2020) 30 tablet 0   ondansetron (ZOFRAN) 4 MG tablet Take 1 tablet (4 mg total) by mouth every 8 (eight) hours as needed. (Patient not taking: Reported on 12/15/2019) 20 tablet 0   ondansetron (ZOFRAN) 4 MG tablet Take 1 tablet (4 mg total) by mouth every 8 (eight) hours as needed. (Patient not taking: Reported on 12/24/2020) 20 tablet 0   No current facility-administered medications for this visit.    Past Medical History:  Diagnosis Date   Gout    HTN (hypertension)    Obesity    Polycystic kidney     Past Surgical History:  Procedure Laterality Date   AV FISTULA PLACEMENT Right    KNEE SURGERY     NO PAST  SURGERIES       Social History   Tobacco Use   Smoking status: Never   Smokeless tobacco: Never  Substance Use Topics   Alcohol use: Yes   Drug use: No      Family History  Problem Relation Age of Onset   Obesity Other      No Known Allergies   REVIEW OF SYSTEMS (Negative unless checked)  Constitutional: [] Weight loss  [] Fever  [] Chills Cardiac: [] Chest pain   [] Chest pressure   [] Palpitations   [] Shortness of breath when laying flat   [] Shortness of breath at rest   [] Shortness of breath with exertion. Vascular:  [] Pain in legs with walking   [] Pain in legs at rest   [] Pain in legs when laying flat   [] Claudication   [] Pain in feet when walking  [] Pain in feet at rest  [] Pain in feet when laying flat   [] History of DVT   [] Phlebitis   [] Swelling in legs   [] Varicose veins   [] Non-healing ulcers Pulmonary:   [] Uses home oxygen   [] Productive cough   [] Hemoptysis   [] Wheeze  [] COPD   [] Asthma Neurologic:  [] Dizziness  [] Blackouts   [] Seizures   [] History of stroke   [] History of TIA  []   Aphasia   [] Temporary blindness   [] Dysphagia   [] Weakness or numbness in arms   [] Weakness or numbness in legs Musculoskeletal:  [] Arthritis   [] Joint swelling   [] Joint pain   [] Low back pain Hematologic:  [] Easy bruising  [] Easy bleeding   [] Hypercoagulable state   [x] Anemic   Gastrointestinal:  [] Blood in stool   [] Vomiting blood  [] Gastroesophageal reflux/heartburn   [] Abdominal pain Genitourinary:  [x] Chronic kidney disease   [] Difficult urination  [] Frequent urination  [] Burning with urination   [] Hematuria Skin:  [] Rashes   [] Ulcers   [] Wounds Psychological:  [] History of anxiety   []  History of major depression.  Physical Examination  BP 112/71 (BP Location: Left Arm)   Pulse 74   Resp 16   Wt 277 lb 3.2 oz (125.7 kg)   BMI 34.65 kg/m  Gen:  WD/WN, NAD Head: /AT, No temporalis wasting. Ear/Nose/Throat: Hearing grossly intact, nares w/o erythema or drainage Eyes: Conjunctiva  clear. Sclera non-icteric Neck: Supple.  Trachea midline Pulmonary:  Good air movement, no use of accessory muscles.  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  No deformity or atrophy.  Mild lower extremity edema. Neurologic: Sensation grossly intact in extremities.  Symmetrical.  Speech is fluent.  Psychiatric: Judgment intact, Mood & affect appropriate for pt's clinical situation. Dermatologic: No rashes or ulcers noted.  No cellulitis or open wounds.      Labs No results found for this or any previous visit (from the past 2160 hour(s)).  Radiology No results found.  Assessment/Plan  CKD (chronic kidney disease) stage 5, GFR less than 15 ml/min (HCC) Right radiocephalic AVF is widely patent on duplex today. Can be used any time.  Recheck in one year.  Essential (primary) hypertension blood pressure control important in reducing the progression of atherosclerotic disease. On appropriate oral medications.    Leotis Pain, MD  05/26/2022 11:37 AM    This note was created with Dragon medical transcription system.  Any errors from dictation are purely unintentional

## 2022-05-26 NOTE — Assessment & Plan Note (Signed)
blood pressure control important in reducing the progression of atherosclerotic disease. On appropriate oral medications.  

## 2022-05-26 NOTE — Assessment & Plan Note (Signed)
Right radiocephalic AVF is widely patent on duplex today. Can be used any time.  Recheck in one year.

## 2023-05-25 ENCOUNTER — Other Ambulatory Visit (INDEPENDENT_AMBULATORY_CARE_PROVIDER_SITE_OTHER): Payer: Self-pay | Admitting: Vascular Surgery

## 2023-05-25 DIAGNOSIS — N185 Chronic kidney disease, stage 5: Secondary | ICD-10-CM

## 2023-05-29 ENCOUNTER — Ambulatory Visit (INDEPENDENT_AMBULATORY_CARE_PROVIDER_SITE_OTHER): Payer: No Typology Code available for payment source

## 2023-05-29 ENCOUNTER — Ambulatory Visit (INDEPENDENT_AMBULATORY_CARE_PROVIDER_SITE_OTHER): Payer: No Typology Code available for payment source | Admitting: Vascular Surgery

## 2023-05-29 ENCOUNTER — Encounter (INDEPENDENT_AMBULATORY_CARE_PROVIDER_SITE_OTHER): Payer: Self-pay | Admitting: Vascular Surgery

## 2023-05-29 VITALS — BP 133/91 | HR 83 | Resp 16 | Wt 295.8 lb

## 2023-05-29 DIAGNOSIS — I770 Arteriovenous fistula, acquired: Secondary | ICD-10-CM

## 2023-05-29 DIAGNOSIS — I10 Essential (primary) hypertension: Secondary | ICD-10-CM

## 2023-05-29 DIAGNOSIS — N185 Chronic kidney disease, stage 5: Secondary | ICD-10-CM | POA: Diagnosis not present

## 2023-05-29 NOTE — Assessment & Plan Note (Signed)
Not yet on dialysis.  AVF is patent.  Recheck in one year.

## 2023-05-29 NOTE — Progress Notes (Signed)
MRN : [id number]  Marc Neal is a 42 y.o. (10/04/1980) male who presents with chief complaint of  Chief Complaint  Patient presents with   Follow-up    63yr HDA follow up  .  History of Present Illness: Patient returns today in follow up of his CKD and previous AVF placement. He has still not used it yet. No problems from the AVF. It is widely patent on duplex today.   Current Outpatient Medications  Medication Sig Dispense Refill   allopurinol (ZYLOPRIM) 100 MG tablet Take 100 mg by mouth daily.     amLODipine (NORVASC) 10 MG tablet Take 1 tablet (10 mg total) by mouth daily. 30 tablet 0   calcitRIOL (ROCALTROL) 0.25 MCG capsule Take by mouth.     cholecalciferol (VITAMIN D) 1000 units tablet Take 2,000 Units by mouth daily.     ferrous sulfate 324 MG TBEC Take 324 mg by mouth 3 (three) times a week.     hydrALAZINE (APRESOLINE) 10 MG tablet Take 1 tablet by mouth 4 (four) times daily.     labetalol (NORMODYNE) 300 MG tablet Take 1 tablet (300 mg total) by mouth 2 (two) times daily. 60 tablet 0   omeprazole (PRILOSEC) 40 MG capsule Take by mouth.     lisinopril (PRINIVIL,ZESTRIL) 40 MG tablet Take 1 tablet (40 mg total) by mouth daily. (Patient not taking: Reported on 12/24/2020) 30 tablet 0   ondansetron (ZOFRAN) 4 MG tablet Take 1 tablet (4 mg total) by mouth every 8 (eight) hours as needed. (Patient not taking: Reported on 12/15/2019) 20 tablet 0   ondansetron (ZOFRAN) 4 MG tablet Take 1 tablet (4 mg total) by mouth every 8 (eight) hours as needed. (Patient not taking: Reported on 12/24/2020) 20 tablet 0   No current facility-administered medications for this visit.    Past Medical History:  Diagnosis Date   Gout    HTN (hypertension)    Obesity    Polycystic kidney     Past Surgical History:  Procedure Laterality Date   AV FISTULA PLACEMENT Right    KNEE SURGERY     NO PAST SURGERIES       Social History   Tobacco Use   Smoking status: Never   Smokeless tobacco:  Never  Substance Use Topics   Alcohol use: Yes   Drug use: No       Family History  Problem Relation Age of Onset   Obesity Other      No Known Allergies   REVIEW OF SYSTEMS (Negative unless checked)  Constitutional: [] Weight loss  [] Fever  [] Chills Cardiac: [] Chest pain   [] Chest pressure   [] Palpitations   [] Shortness of breath when laying flat   [] Shortness of breath at rest   [] Shortness of breath with exertion. Vascular:  [] Pain in legs with walking   [] Pain in legs at rest   [] Pain in legs when laying flat   [] Claudication   [] Pain in feet when walking  [] Pain in feet at rest  [] Pain in feet when laying flat   [] History of DVT   [] Phlebitis   [] Swelling in legs   [] Varicose veins   [] Non-healing ulcers Pulmonary:   [] Uses home oxygen   [] Productive cough   [] Hemoptysis   [] Wheeze  [] COPD   [] Asthma Neurologic:  [] Dizziness  [] Blackouts   [] Seizures   [] History of stroke   [] History of TIA  [] Aphasia   [] Temporary blindness   [] Dysphagia   [] Weakness or numbness in  arms   [] Weakness or numbness in legs Musculoskeletal:  [] Arthritis   [] Joint swelling   [] Joint pain   [] Low back pain Hematologic:  [] Easy bruising  [] Easy bleeding   [] Hypercoagulable state   [x] Anemic   Gastrointestinal:  [] Blood in stool   [] Vomiting blood  [] Gastroesophageal reflux/heartburn   [] Abdominal pain Genitourinary:  [x] Chronic kidney disease   [] Difficult urination  [] Frequent urination  [] Burning with urination   [] Hematuria Skin:  [] Rashes   [] Ulcers   [] Wounds Psychological:  [] History of anxiety   []  History of major depression.  Physical Examination  BP (!) 133/91 (BP Location: Left Arm)   Pulse 83   Resp 16   Wt 295 lb 12.8 oz (134.2 kg)   BMI 36.97 kg/m  Gen:  WD/WN, NAD Head: [place]/AT, No temporalis wasting. Ear/Nose/Throat: Hearing grossly intact, nares w/o erythema or drainage Eyes: Conjunctiva clear. Sclera non-icteric Neck: Supple.  Trachea midline Pulmonary:  Good air movement, no  use of accessory muscles.  Cardiac: RRR, no JVD Vascular: good thrill in right radiocephalic AVF Vessel Right Left  Radial Palpable Palpable               Musculoskeletal: M/S 5/5 throughout.  No deformity or atrophy. No edema. Neurologic: Sensation grossly intact in extremities.  Symmetrical.  Speech is fluent.  Psychiatric: Judgment intact, Mood & affect appropriate for pt's clinical situation. Dermatologic: No rashes or ulcers noted.  No cellulitis or open wounds.      Labs No results found for this or any previous visit (from the past 2160 hour(s)).  Radiology No results found.  Assessment/Plan  Essential (primary) hypertension blood pressure control important in reducing the progression of atherosclerotic disease and renal insufficiency. On appropriate oral medications.   AVF (arteriovenous fistula) (HCC) Patent on duplex.  Yet to be used  CKD (chronic kidney disease) stage 5, GFR less than 15 ml/min (HCC) Not yet on dialysis.  AVF is patent.  Recheck in one year.     Festus Barren, MD  05/29/2023 9:26 AM    This note was created with Dragon medical transcription system.  Any errors from dictation are purely unintentional

## 2023-05-29 NOTE — Assessment & Plan Note (Signed)
Patent on duplex.  Yet to be used

## 2023-05-29 NOTE — Assessment & Plan Note (Signed)
blood pressure control important in reducing the progression of atherosclerotic disease and renal insufficiency. On appropriate oral medications.

## 2023-06-13 ENCOUNTER — Telehealth (INDEPENDENT_AMBULATORY_CARE_PROVIDER_SITE_OTHER): Payer: Self-pay

## 2023-06-13 NOTE — Telephone Encounter (Signed)
Error

## 2023-10-07 ENCOUNTER — Emergency Department
Admission: EM | Admit: 2023-10-07 | Discharge: 2023-10-07 | Disposition: A | Discharge status: Home / Self Care | Attending: Emergency Medicine | Admitting: Emergency Medicine

## 2023-10-07 ENCOUNTER — Other Ambulatory Visit: Payer: Self-pay

## 2023-10-07 ENCOUNTER — Emergency Department

## 2023-10-07 DIAGNOSIS — R319 Hematuria, unspecified: Secondary | ICD-10-CM | POA: Diagnosis not present

## 2023-10-07 DIAGNOSIS — N185 Chronic kidney disease, stage 5: Secondary | ICD-10-CM | POA: Insufficient documentation

## 2023-10-07 DIAGNOSIS — D649 Anemia, unspecified: Secondary | ICD-10-CM | POA: Diagnosis not present

## 2023-10-07 DIAGNOSIS — I120 Hypertensive chronic kidney disease with stage 5 chronic kidney disease or end stage renal disease: Secondary | ICD-10-CM | POA: Insufficient documentation

## 2023-10-07 DIAGNOSIS — R1032 Left lower quadrant pain: Secondary | ICD-10-CM | POA: Diagnosis present

## 2023-10-07 LAB — CBC WITH DIFFERENTIAL/PLATELET
Abs Immature Granulocytes: 0.06 10*3/uL (ref 0.00–0.07)
Basophils Absolute: 0 10*3/uL (ref 0.0–0.1)
Basophils Relative: 0 %
Eosinophils Absolute: 0 10*3/uL (ref 0.0–0.5)
Eosinophils Relative: 0 %
HCT: 34 % — ABNORMAL LOW (ref 39.0–52.0)
Hemoglobin: 10.9 g/dL — ABNORMAL LOW (ref 13.0–17.0)
Immature Granulocytes: 1 %
Lymphocytes Relative: 8 %
Lymphs Abs: 0.8 10*3/uL (ref 0.7–4.0)
MCH: 28.5 pg (ref 26.0–34.0)
MCHC: 32.1 g/dL (ref 30.0–36.0)
MCV: 88.8 fL (ref 80.0–100.0)
Monocytes Absolute: 0.6 10*3/uL (ref 0.1–1.0)
Monocytes Relative: 6 %
Neutro Abs: 8.3 10*3/uL — ABNORMAL HIGH (ref 1.7–7.7)
Neutrophils Relative %: 85 %
Platelets: 166 10*3/uL (ref 150–400)
RBC: 3.83 MIL/uL — ABNORMAL LOW (ref 4.22–5.81)
RDW: 12.9 % (ref 11.5–15.5)
WBC: 9.8 10*3/uL (ref 4.0–10.5)
nRBC: 0 % (ref 0.0–0.2)

## 2023-10-07 LAB — COMPREHENSIVE METABOLIC PANEL
ALT: 12 U/L (ref 0–44)
AST: 13 U/L — ABNORMAL LOW (ref 15–41)
Albumin: 4 g/dL (ref 3.5–5.0)
Alkaline Phosphatase: 47 U/L (ref 38–126)
Anion gap: 14 (ref 5–15)
BUN: 87 mg/dL — ABNORMAL HIGH (ref 6–20)
CO2: 15 mmol/L — ABNORMAL LOW (ref 22–32)
Calcium: 8.8 mg/dL — ABNORMAL LOW (ref 8.9–10.3)
Chloride: 108 mmol/L (ref 98–111)
Creatinine, Ser: 10.05 mg/dL — ABNORMAL HIGH (ref 0.61–1.24)
GFR, Estimated: 6 mL/min — ABNORMAL LOW (ref >60)
Glucose, Bld: 100 mg/dL — ABNORMAL HIGH (ref 70–99)
Potassium: 5.3 mmol/L — ABNORMAL HIGH (ref 3.5–5.1)
Sodium: 137 mmol/L (ref 135–145)
Total Bilirubin: 0.7 mg/dL (ref 0.0–1.2)
Total Protein: 7.8 g/dL (ref 6.5–8.1)

## 2023-10-07 LAB — URINALYSIS, ROUTINE W REFLEX MICROSCOPIC
Bilirubin Urine: NEGATIVE
Glucose, UA: 50 mg/dL — AB
Ketones, ur: NEGATIVE mg/dL
Leukocytes,Ua: NEGATIVE
Nitrite: NEGATIVE
Protein, ur: NEGATIVE mg/dL
Specific Gravity, Urine: 1.008 (ref 1.005–1.030)
Squamous Epithelial / HPF: 0 /HPF (ref 0–5)
pH: 5 (ref 5.0–8.0)

## 2023-10-07 LAB — LIPASE, BLOOD: Lipase: 27 U/L (ref 11–51)

## 2023-10-07 LAB — TROPONIN I (HIGH SENSITIVITY): Troponin I (High Sensitivity): 6 ng/L (ref <18)

## 2023-10-07 MED ORDER — FENTANYL CITRATE PF 50 MCG/ML IJ SOSY
50.0000 ug | PREFILLED_SYRINGE | Freq: Once | INTRAMUSCULAR | Status: AC
  Administered 2023-10-07: 50 ug via INTRAVENOUS
  Filled 2023-10-07: qty 1

## 2023-10-07 MED ORDER — ONDANSETRON HCL 4 MG/2ML IJ SOLN
4.0000 mg | Freq: Once | INTRAMUSCULAR | Status: AC
  Administered 2023-10-07: 4 mg via INTRAVENOUS
  Filled 2023-10-07: qty 2

## 2023-10-07 MED ORDER — LOKELMA 10 G PO PACK
10.0000 g | PACK | Freq: Two times a day (BID) | ORAL | 0 refills | Status: DC
Start: 2023-10-07 — End: 2023-10-13

## 2023-10-07 MED ORDER — SODIUM CHLORIDE 0.9 % IV BOLUS
1000.0000 mL | Freq: Once | INTRAVENOUS | Status: AC
  Administered 2023-10-07: 1000 mL via INTRAVENOUS

## 2023-10-07 NOTE — ED Provider Notes (Signed)
 [facility] Provider Note    Event Date/Time   First MD Initiated Contact with Patient 10/07/23 Rickey Primus     (approximate)   History   Flank Pain   HPI  Marc Neal is a 43 y.o. male with history of CKD not on dialysis, polycystic kidney disease, gout, hypertension and as listed in EMR presents to the emergency department for treatment and evaluation of left lateral to left lower quadrant abdominal pain.  Pain started around 2 AM.  He went to the [facility] emergency department and was worked up with labs and CT then discharged home with pain medication.  Patient continues to have pain.      Physical Exam   Triage Vital Signs: ED Triage Vitals [10/07/23 1812]  Encounter Vitals Group     BP (!) 158/95     Systolic BP Percentile      Diastolic BP Percentile      Pulse Rate (!) 105     Resp 18     Temp 99 F (37.2 C)     Temp Source Oral     SpO2 98 %     Weight      Height 6\' 3"  (1.905 m)     Head Circumference      Peak Flow      Pain Score 9     Pain Loc      Pain Education      Exclude from Growth Chart     Most recent vital signs: Vitals:   10/07/23 1812  BP: (!) 158/95  Pulse: (!) 105  Resp: 18  Temp: 99 F (37.2 C)  SpO2: 98%    General: Awake, no distress.  CV:  Good peripheral perfusion.  Resp:  Normal effort.  Abd:  No distention. Soft. Focal LLQ pain with palpation. Other:  Fistula right forearm with good thrill.   ED Results / Procedures / Treatments   Labs (all labs ordered are listed, but only abnormal results are displayed) Labs Reviewed  URINALYSIS, ROUTINE W REFLEX MICROSCOPIC - Abnormal; Notable for the following components:      Result Value   Color, Urine STRAW (*)    APPearance CLEAR (*)    Glucose, UA 50 (*)    Hgb urine dipstick MODERATE (*)    Bacteria, UA RARE (*)    All other components within normal limits  CBC WITH DIFFERENTIAL/PLATELET - Abnormal; Notable for the following components:   RBC 3.83 (*)     Hemoglobin 10.9 (*)    HCT 34.0 (*)    Neutro Abs 8.3 (*)    All other components within normal limits  COMPREHENSIVE METABOLIC PANEL - Abnormal; Notable for the following components:   Potassium 5.3 (*)    CO2 15 (*)    Glucose, Bld 100 (*)    BUN 87 (*)    Creatinine, Ser 10.05 (*)    Calcium 8.8 (*)    AST 13 (*)    GFR, Estimated 6 (*)    All other components within normal limits  LIPASE, BLOOD  TROPONIN I (HIGH SENSITIVITY)     EKG  Sinus tachycardia with rate of 105   RADIOLOGY  Image and radiology report reviewed and interpreted by me. Radiology report consistent with the same.  Chest x-ray shows bibasilar atelectasis, low air volume but otherwise no cardiopulmonary abnormality.  PROCEDURES:  Critical Care performed: No  Procedures   MEDICATIONS ORDERED IN ED:  Medications  fentaNYL (SUBLIMAZE) injection  50 mcg (50 mcg Intravenous Given 10/07/23 1911)  sodium chloride 0.9 % bolus 1,000 mL (0 mLs Intravenous Stopped 10/07/23 2055)  ondansetron (ZOFRAN) injection 4 mg (4 mg Intravenous Given 10/07/23 1938)     IMPRESSION / MDM / ASSESSMENT AND PLAN / ED COURSE   I have reviewed the triage note.  Differential diagnosis includes, but is not limited to, kidney stone, cyst from polycystic kidney disease rupture, pyelonephritis  Patient's presentation is most consistent with acute presentation with potential threat to life or bodily function.  Patient on cardiac monitor to observe for arrhythmia and rate changes.  43 year old male presenting to the emergency department for treatment and evaluation of left lower quadrant pain.  See HPI for further details.  On exam, he does have focal left lower quadrant tenderness.  When the left upper abdomen is palpated he feels the pain in the left lower quadrant.  He denies nausea or vomiting.  No constipation or diarrhea.  No known fever.  He was evaluated for this pain at the [facility] and was discharged after labs and CT.   Oxycodone prescribed is not helping his pain.  Labs from ER visit at the [facility] reviewed.  Patient is noted to have a BUN of 77 and a creatinine of 10.62.  Potassium at that time was 4.8.  Labs here tonight show a BUN of 87 and a creatinine of 10.05 with a GFR of 6.  Potassium is elevated now up to 5.3.  Urinalysis shows rare bacteria and moderate amount of hemoglobin but is otherwise negative.  CBC shows a stable anemia.  No leukocytosis.  With these results, troponin and EKG added.  Labs reviewed with the patient and his significant other.  Patient has had a fistula for the past few years but has never had dialysis.  He goes to the [facility].   Patient urinated about of red colored urine. He states that his pain has significantly improved. History of hematuria due to kidney stone.   Case discussed with Dr. Scotty Court. Outside records reviewed together. Kidney function has consistently been low and is currently near baseline. No significant EKG findings tonight. If all else appears stable, plan would be to have patient take lokelma and follow up with his nephrologist.  Troponin is normal, chest x-ray shows bibasilar atelectasis and low lung volume without any other concern.  Admission considered and discussed with patient. He feels comfortable going home on lokelma and following up with nephrology. He states he has been advised to call "when he feels bad enough to start dialysis"  and would be worked into the schedule. Strict ER return precautions discussed and patient agrees to return for any symptom of concern. Wife at bedside and will encourage compliance.      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   Chinita Pester, FNP 10/07/23 2122    Sharman Cheek, MD 10/07/23 2322

## 2023-10-07 NOTE — ED Triage Notes (Signed)
 Pt to ed from home via POV for left sided flank pain that started at 0200 this morning. Pt was seen today at [facility] and received a full work up consisting of labs and CT scan and all were normal and was prescribed oxycodone per his chart review. Pt is caox4, in no acute distress in triage.

## 2023-10-07 NOTE — Discharge Instructions (Signed)
 It is very important that you call and schedule a follow-up appointment with your nephrologist.  Return to the emergency department immediately if you develop shortness of breath, chest pain, worsening abdominal pain, palpitations, or other symptoms of concern.

## 2023-10-07 NOTE — ED Notes (Signed)
 Pt states he is having LLQ abdominal pain since 0200 today. Not back pain or side pain. Pt took oxycodone around 8am. See triage note, was worked up at [facility] for flank pain workup (between 4am and 8am). Has not had CT with contrast. Pt grimacing.  Pt having urinary frequency. R arm is restricted.

## 2023-10-11 ENCOUNTER — Inpatient Hospital Stay (HOSPITAL_COMMUNITY)
Admission: EM | Admit: 2023-10-11 | Discharge: 2023-10-13 | DRG: 683 | Disposition: A | Discharge status: Home / Self Care | Attending: Internal Medicine | Admitting: Internal Medicine

## 2023-10-11 ENCOUNTER — Other Ambulatory Visit: Payer: Self-pay

## 2023-10-11 ENCOUNTER — Encounter (HOSPITAL_COMMUNITY): Payer: Self-pay

## 2023-10-11 DIAGNOSIS — N186 End stage renal disease: Secondary | ICD-10-CM | POA: Diagnosis not present

## 2023-10-11 DIAGNOSIS — D631 Anemia in chronic kidney disease: Secondary | ICD-10-CM | POA: Diagnosis present

## 2023-10-11 DIAGNOSIS — Z79899 Other long term (current) drug therapy: Secondary | ICD-10-CM

## 2023-10-11 DIAGNOSIS — R310 Gross hematuria: Secondary | ICD-10-CM | POA: Diagnosis present

## 2023-10-11 DIAGNOSIS — I10 Essential (primary) hypertension: Secondary | ICD-10-CM

## 2023-10-11 DIAGNOSIS — Z6836 Body mass index (BMI) 36.0-36.9, adult: Secondary | ICD-10-CM

## 2023-10-11 DIAGNOSIS — I151 Hypertension secondary to other renal disorders: Secondary | ICD-10-CM | POA: Diagnosis present

## 2023-10-11 DIAGNOSIS — N3090 Cystitis, unspecified without hematuria: Secondary | ICD-10-CM | POA: Diagnosis not present

## 2023-10-11 DIAGNOSIS — Z992 Dependence on renal dialysis: Principal | ICD-10-CM

## 2023-10-11 DIAGNOSIS — E669 Obesity, unspecified: Secondary | ICD-10-CM | POA: Diagnosis present

## 2023-10-11 DIAGNOSIS — Q613 Polycystic kidney, unspecified: Secondary | ICD-10-CM

## 2023-10-11 DIAGNOSIS — E875 Hyperkalemia: Secondary | ICD-10-CM | POA: Diagnosis present

## 2023-10-11 DIAGNOSIS — M109 Gout, unspecified: Secondary | ICD-10-CM | POA: Diagnosis present

## 2023-10-11 LAB — CBC WITH DIFFERENTIAL/PLATELET
Abs Immature Granulocytes: 0.02 10*3/uL (ref 0.00–0.07)
Basophils Absolute: 0 10*3/uL (ref 0.0–0.1)
Basophils Relative: 1 %
Eosinophils Absolute: 0.1 10*3/uL (ref 0.0–0.5)
Eosinophils Relative: 1 %
HCT: 37.6 % — ABNORMAL LOW (ref 39.0–52.0)
Hemoglobin: 11.8 g/dL — ABNORMAL LOW (ref 13.0–17.0)
Immature Granulocytes: 0 %
Lymphocytes Relative: 16 %
Lymphs Abs: 1.3 10*3/uL (ref 0.7–4.0)
MCH: 27.8 pg (ref 26.0–34.0)
MCHC: 31.4 g/dL (ref 30.0–36.0)
MCV: 88.5 fL (ref 80.0–100.0)
Monocytes Absolute: 0.7 10*3/uL (ref 0.1–1.0)
Monocytes Relative: 9 %
Neutro Abs: 6.1 10*3/uL (ref 1.7–7.7)
Neutrophils Relative %: 73 %
Platelets: 256 10*3/uL (ref 150–400)
RBC: 4.25 MIL/uL (ref 4.22–5.81)
RDW: 12.4 % (ref 11.5–15.5)
WBC: 8.3 10*3/uL (ref 4.0–10.5)
nRBC: 0 % (ref 0.0–0.2)

## 2023-10-11 LAB — HEPATITIS B SURFACE ANTIGEN: Hepatitis B Surface Ag: NONREACTIVE

## 2023-10-11 LAB — COMPREHENSIVE METABOLIC PANEL
ALT: 10 U/L (ref 0–44)
AST: 12 U/L — ABNORMAL LOW (ref 15–41)
Albumin: 4 g/dL (ref 3.5–5.0)
Alkaline Phosphatase: 45 U/L (ref 38–126)
Anion gap: 16 — ABNORMAL HIGH (ref 5–15)
BUN: 102 mg/dL — ABNORMAL HIGH (ref 6–20)
CO2: 17 mmol/L — ABNORMAL LOW (ref 22–32)
Calcium: 9.6 mg/dL (ref 8.9–10.3)
Chloride: 104 mmol/L (ref 98–111)
Creatinine, Ser: 12.45 mg/dL — ABNORMAL HIGH (ref 0.61–1.24)
GFR, Estimated: 5 mL/min — ABNORMAL LOW (ref >60)
Glucose, Bld: 104 mg/dL — ABNORMAL HIGH (ref 70–99)
Potassium: 5.3 mmol/L — ABNORMAL HIGH (ref 3.5–5.1)
Sodium: 137 mmol/L (ref 135–145)
Total Bilirubin: 0.7 mg/dL (ref 0.0–1.2)
Total Protein: 9 g/dL — ABNORMAL HIGH (ref 6.5–8.1)

## 2023-10-11 LAB — URINALYSIS, ROUTINE W REFLEX MICROSCOPIC
Bilirubin Urine: NEGATIVE
Glucose, UA: NEGATIVE mg/dL
Ketones, ur: NEGATIVE mg/dL
Nitrite: NEGATIVE
Protein, ur: 100 mg/dL — AB
RBC / HPF: >50 RBC/hpf (ref 0–5)
Specific Gravity, Urine: 1.01 (ref 1.005–1.030)
WBC, UA: >50 WBC/hpf (ref 0–5)
pH: 5 (ref 5.0–8.0)

## 2023-10-11 LAB — LIPASE, BLOOD: Lipase: 34 U/L (ref 11–51)

## 2023-10-11 MED ORDER — SODIUM CHLORIDE 0.9 % IV SOLN
1.0000 g | Freq: Once | INTRAVENOUS | Status: AC
  Administered 2023-10-11: 1 g via INTRAVENOUS
  Filled 2023-10-11: qty 10

## 2023-10-11 MED ORDER — ONDANSETRON HCL 4 MG/2ML IJ SOLN
4.0000 mg | Freq: Four times a day (QID) | INTRAMUSCULAR | Status: DC | PRN
  Administered 2023-10-12 – 2023-10-13 (×2): 4 mg via INTRAVENOUS
  Filled 2023-10-11 (×2): qty 2

## 2023-10-11 MED ORDER — ONDANSETRON HCL 4 MG PO TABS
4.0000 mg | ORAL_TABLET | Freq: Four times a day (QID) | ORAL | Status: DC | PRN
Start: 2023-10-11 — End: 2023-10-13

## 2023-10-11 MED ORDER — CHLORHEXIDINE GLUCONATE CLOTH 2 % EX PADS
6.0000 | MEDICATED_PAD | Freq: Every day | CUTANEOUS | Status: DC
  Administered 2023-10-12 – 2023-10-13 (×2): 6 via TOPICAL

## 2023-10-11 MED ORDER — HEPARIN SODIUM (PORCINE) 5000 UNIT/ML IJ SOLN
5000.0000 [IU] | Freq: Three times a day (TID) | INTRAMUSCULAR | Status: DC
  Administered 2023-10-12 – 2023-10-13 (×4): 5000 [IU] via SUBCUTANEOUS
  Filled 2023-10-11 (×4): qty 1

## 2023-10-11 MED ORDER — MELATONIN 3 MG PO TABS: 3.0000 mg | ORAL_TABLET | Freq: Every evening | ORAL | Status: DC | PRN

## 2023-10-11 MED ORDER — SODIUM CHLORIDE 0.9 % IV SOLN
1.0000 g | INTRAVENOUS | Status: DC
  Administered 2023-10-12 – 2023-10-13 (×2): 1 g via INTRAVENOUS
  Filled 2023-10-11 (×2): qty 10

## 2023-10-11 NOTE — ED Triage Notes (Signed)
 Pt came in via POV d/t 6/10 flank pain & bloody urine for 4 days. Pt states his nephrologist told him to come in for eval if s/s persisted by Monday & so he is here today. Pt states that he is at the stage where he needs to start dialysis but he has not heard from the center yet. Endorses loss of appetite recently, nausea & fatigue.

## 2023-10-11 NOTE — H&P (Signed)
 History and Physical    Patient: Marc Neal JWJ:[id number] DOB: 1980-10-28 DOA: 10/11/2023 DOS: the patient was seen and examined on 10/11/2023 PCP: [facility]  Patient coming from: Home  Chief Complaint:  Chief Complaint  Patient presents with   Blood in Urine   HPI: Marc Neal is a 43 y.o. male with medical history significant for ESRD with a baseline creatinine of 9.  The patient had a fistula in placed in anticipation of dialysis 3 years ago.  4 days ago the patient woke up and felt terrible.  He had right flank pain and started urinating blood.  He also started cramping a lot and felt fatigued and winded even after short walks.  None of that is his baseline.  He was also having a low-grade fever at home.  The patient had a regularly scheduled nephrology appointment 3 days ago.  He went into see them and was diagnosed with a UTI and preparations were made to start him on dialysis.  He was told that if he got worse before an outpatient dialysis site could be found that he should come to the ER.  2 days later he continued to feel really poorly and he continued to have some frank bleeding with urination so he presented to the emergency department.  The patient's creatinine had increased to 12 and nephrology advised admission to initiate hemodialysis.    Review of Systems: As mentioned in the history of present illness. All other systems reviewed and are negative. Past Medical History:  Diagnosis Date   Gout    HTN (hypertension)    Obesity    Polycystic kidney    Past Surgical History:  Procedure Laterality Date   AV FISTULA PLACEMENT Right    KNEE SURGERY     NO PAST SURGERIES     Social History:  reports that he has never smoked. He has never used smokeless tobacco. He reports current alcohol use. He reports that he does not use drugs.  No Known Allergies  Family History  Problem Relation Age of Onset   Obesity Other     Prior to Admission medications    Medication Sig Start Date End Date Taking? Authorizing Provider  allopurinol (ZYLOPRIM) 100 MG tablet Take 100 mg by mouth daily.    [provider]  amLODipine (NORVASC) 10 MG tablet Take 1 tablet (10 mg total) by mouth daily. 01/25/17   Auburn Bilberry, MD  calcitRIOL (ROCALTROL) 0.25 MCG capsule Take by mouth. 11/29/20   [provider]  cholecalciferol (VITAMIN D) 1000 units tablet Take 2,000 Units by mouth daily.    [provider]  ferrous sulfate 324 MG TBEC Take 324 mg by mouth 3 (three) times a week.    [provider]  hydrALAZINE (APRESOLINE) 10 MG tablet Take 1 tablet by mouth 4 (four) times daily. 11/26/20   [provider]  labetalol (NORMODYNE) 300 MG tablet Take 1 tablet (300 mg total) by mouth 2 (two) times daily. 01/24/17   Auburn Bilberry, MD  lisinopril (PRINIVIL,ZESTRIL) 40 MG tablet Take 1 tablet (40 mg total) by mouth daily. Patient not taking: Reported on 12/24/2020 01/25/17   Auburn Bilberry, MD  omeprazole (PRILOSEC) 40 MG capsule Take by mouth. 03/04/20   [provider]  ondansetron (ZOFRAN) 4 MG tablet Take 1 tablet (4 mg total) by mouth every 8 (eight) hours as needed. Patient not taking: Reported on 12/15/2019 11/09/19   Phineas Semen, MD  ondansetron [facility]) 4 MG tablet  Take 1 tablet (4 mg total) by mouth every 8 (eight) hours as needed. Patient not taking: Reported on 12/24/2020 12/06/20   Phineas Semen, MD  sodium zirconium cyclosilicate (LOKELMA) 10 g PACK packet Take 10 g by mouth 2 (two) times daily for 7 days. 10/07/23 10/14/23  Chinita Pester, FNP    Physical Exam: Vitals:   10/11/23 1034 10/11/23 1214 10/11/23 1536 10/11/23 1845  BP: (!) 149/97  (!) 150/99 127/80  Pulse: 94  91 89  Resp: 17  18 20   Temp: 98.2 F (36.8 C)  98.5 F (36.9 C)   TempSrc: Oral  Oral   SpO2: 98%  100% 100%  Weight:  131.5 kg    Height:  6\' 3"  (1.905 m)     Physical Exam:  General: No acute distress, well developed, well  nourished HEENT: Normocephalic, atraumatic, PERRL Cardiovascular: Normal rate and rhythm. Distal pulses intact. Pulmonary: Normal pulmonary effort, normal breath sounds Gastrointestinal: Nondistended abdomen, soft, non-tender, normoactive bowel sounds, No flank pain Musculoskeletal:Normal ROM, no lower ext edema, thrill present in fistula in right arm Skin: Skin is warm and dry. Neuro: No focal deficits noted, AAOx3. PSYCH: Attentive and cooperative  Data Reviewed:  Results for orders placed or performed during the hospital encounter of 10/11/23 (from the past 24 hours)  CBC with Differential     Status: Abnormal   Collection Time: 10/11/23 12:28 PM  Result Value Ref Range   WBC 8.3 4.0 - 10.5 K/uL   RBC 4.25 4.22 - 5.81 MIL/uL   Hemoglobin 11.8 (L) 13.0 - 17.0 g/dL   HCT 16.1 (L) 09.6 - 04.5 %   MCV 88.5 80.0 - 100.0 fL   MCH 27.8 26.0 - 34.0 pg   MCHC 31.4 30.0 - 36.0 g/dL   RDW 40.9 81.1 - 91.4 %   Platelets 256 150 - 400 K/uL   nRBC 0.0 0.0 - 0.2 %   Neutrophils Relative % 73 %   Neutro Abs 6.1 1.7 - 7.7 K/uL   Lymphocytes Relative 16 %   Lymphs Abs 1.3 0.7 - 4.0 K/uL   Monocytes Relative 9 %   Monocytes Absolute 0.7 0.1 - 1.0 K/uL   Eosinophils Relative 1 %   Eosinophils Absolute 0.1 0.0 - 0.5 K/uL   Basophils Relative 1 %   Basophils Absolute 0.0 0.0 - 0.1 K/uL   Immature Granulocytes 0 %   Abs Immature Granulocytes 0.02 0.00 - 0.07 K/uL  Comprehensive metabolic panel     Status: Abnormal   Collection Time: 10/11/23 12:28 PM  Result Value Ref Range   Sodium 137 135 - 145 mmol/L   Potassium 5.3 (H) 3.5 - 5.1 mmol/L   Chloride 104 98 - 111 mmol/L   CO2 17 (L) 22 - 32 mmol/L   Glucose, Bld 104 (H) 70 - 99 mg/dL   BUN 782 (H) 6 - 20 mg/dL   Creatinine, Ser 95.62 (H) 0.61 - 1.24 mg/dL   Calcium 9.6 8.9 - 13.0 mg/dL   Total Protein 9.0 (H) 6.5 - 8.1 g/dL   Albumin 4.0 3.5 - 5.0 g/dL   AST 12 (L) 15 - 41 U/L   ALT 10 0 - 44 U/L   Alkaline Phosphatase 45 38 - 126  U/L   Total Bilirubin 0.7 0.0 - 1.2 mg/dL   GFR, Estimated 5 (L) >60 mL/min   Anion gap 16 (H) 5 - 15  Lipase, blood     Status: None   Collection Time: 10/11/23 12:28 PM  Result Value Ref Range   Lipase 34 11 - 51 U/L  Urinalysis, Routine w reflex microscopic -Urine, Clean Catch     Status: Abnormal   Collection Time: 10/11/23 12:28 PM  Result Value Ref Range   Color, Urine AMBER (A) YELLOW   APPearance CLOUDY (A) CLEAR   Specific Gravity, Urine 1.010 1.005 - 1.030   pH 5.0 5.0 - 8.0   Glucose, UA NEGATIVE NEGATIVE mg/dL   Hgb urine dipstick MODERATE (A) NEGATIVE   Bilirubin Urine NEGATIVE NEGATIVE   Ketones, ur NEGATIVE NEGATIVE mg/dL   Protein, ur 161 (A) NEGATIVE mg/dL   Nitrite NEGATIVE NEGATIVE   Leukocytes,Ua MODERATE (A) NEGATIVE   RBC / HPF >50 0 - 5 RBC/hpf   WBC, UA >50 0 - 5 WBC/hpf   Bacteria, UA MANY (A) NONE SEEN   Squamous Epithelial / HPF 0-5 0 - 5 /HPF   WBC Clumps PRESENT    Mucus PRESENT    Non Squamous Epithelial 0-5 (A) NONE SEEN     Assessment and Plan: ESRD - HD to be initiated per nephrology. 2.  Cystitis with frank hematuria - IV Rocephin.  The patient says his flank pain has already decreased.  3. Htn - Continue routine meds    Advance Care Planning:   Code Status: Prior the patient names his wife as a Runner, broadcasting/film/video and he wants to be full code.  Consults: Nephrology  Family Communication: None  Severity of Illness: The appropriate patient status for this patient is OBSERVATION. Observation status is judged to be reasonable and necessary in order to provide the required intensity of service to ensure the patient's safety. The patient's presenting symptoms, physical exam findings, and initial radiographic and laboratory data in the context of their medical condition is felt to place them at decreased risk for further clinical deterioration. Furthermore, it is anticipated that the patient will be medically stable for discharge from the  hospital within 2 midnights of admission.   Author: Buena Irish, MD 10/11/2023 7:05 PM  For on call review [email or web address].

## 2023-10-11 NOTE — Plan of Care (Signed)
  Problem: Education: Goal: Knowledge of General Education information will improve Description: Including pain rating scale, medication(s)/side effects and non-pharmacologic comfort measures Outcome: Progressing   Problem: Clinical Measurements: Goal: Ability to maintain clinical measurements within normal limits will improve Outcome: Progressing Goal: Will remain free from infection Outcome: Progressing Goal: Diagnostic test results will improve Outcome: Progressing Goal: Respiratory complications will improve Outcome: Progressing Goal: Cardiovascular complication will be avoided Outcome: Progressing   Problem: Activity: Goal: Risk for activity intolerance will decrease Outcome: Progressing   Problem: Nutrition: Goal: Adequate nutrition will be maintained Outcome: Progressing   Problem: Coping: Goal: Level of anxiety will decrease Outcome: Progressing   Problem: Elimination: Goal: Will not experience complications related to bowel motility Outcome: Progressing Goal: Will not experience complications related to urinary retention Outcome: Progressing   Problem: Pain Managment: Goal: General experience of comfort will improve and/or be controlled Outcome: Progressing   Problem: Safety: Goal: Ability to remain free from injury will improve Outcome: Progressing   Problem: Skin Integrity: Goal: Risk for impaired skin integrity will decrease Outcome: Progressing   Problem: Education: Goal: Knowledge of disease and its progression will improve Outcome: Progressing   Problem: Clinical Measurements: Goal: Complications related to the disease process or treatment will be avoided or minimized Outcome: Progressing   Problem: Activity: Goal: Activity intolerance will improve Outcome: Progressing   Problem: Fluid Volume: Goal: Fluid volume balance will be maintained or improved Outcome: Progressing   Problem: Nutritional: Goal: Ability to make appropriate dietary  choices will improve Outcome: Progressing   Problem: Respiratory: Goal: Respiratory symptoms related to disease process will be avoided Outcome: Progressing

## 2023-10-11 NOTE — ED Provider Notes (Signed)
 [place] EMERGENCY DEPARTMENT AT [facility] Provider Note   CSN: [id number] Arrival date & time: 10/11/23  1031     History  Chief Complaint  Patient presents with   Blood in Urine    Marc Neal is a 43 y.o. male history of ESRD not on dialysis, here presenting with flank pain and also hematuria and worsening fatigue.  Patient has known CKD.  Patient has been follow-up with nephrology already.  Patient does have a mature fistula.  Patient states that about a week ago, he went down to [place] to celebrate with his wife.  He states that he had severe hematuria and flank pain at that time.  He went to the ER and was thought to have passed a kidney stone.  Patient then went back to [facility] on the 16th.  Patient had worsening kidney function at that time with creatinine of 10.  Patient was told that he needs to follow-up with nephrology.  Patient saw nephrology this week.  Patient was told that he needs to start on dialysis and has one scheduled on Monday.  Patient states that since then, he has been not been feeling well.  He has worsening fatigue.  Also has persistent hematuria.  Denies any fevers.  He has some subjective shortness of breath as well.  The history is provided by the patient.       Home Medications Prior to Admission medications   Medication Sig Start Date End Date Taking? Authorizing Provider  allopurinol (ZYLOPRIM) 100 MG tablet Take 100 mg by mouth daily.    [provider]  amLODipine (NORVASC) 10 MG tablet Take 1 tablet (10 mg total) by mouth daily. 01/25/17   Auburn Bilberry, MD  calcitRIOL (ROCALTROL) 0.25 MCG capsule Take by mouth. 11/29/20   [provider]  cholecalciferol (VITAMIN D) 1000 units tablet Take 2,000 Units by mouth daily.    [provider]  ferrous sulfate 324 MG TBEC Take 324 mg by mouth 3 (three) times a week.    [provider]  hydrALAZINE (APRESOLINE) 10 MG tablet Take 1 tablet by  mouth 4 (four) times daily. 11/26/20   [provider]  labetalol (NORMODYNE) 300 MG tablet Take 1 tablet (300 mg total) by mouth 2 (two) times daily. 01/24/17   Auburn Bilberry, MD  lisinopril (PRINIVIL,ZESTRIL) 40 MG tablet Take 1 tablet (40 mg total) by mouth daily. Patient not taking: Reported on 12/24/2020 01/25/17   Auburn Bilberry, MD  omeprazole (PRILOSEC) 40 MG capsule Take by mouth. 03/04/20   [provider]  ondansetron (ZOFRAN) 4 MG tablet Take 1 tablet (4 mg total) by mouth every 8 (eight) hours as needed. Patient not taking: Reported on 12/15/2019 11/09/19   Phineas Semen, MD  ondansetron (ZOFRAN) 4 MG tablet Take 1 tablet (4 mg total) by mouth every 8 (eight) hours as needed. Patient not taking: Reported on 12/24/2020 12/06/20   Phineas Semen, MD  sodium zirconium cyclosilicate (LOKELMA) 10 g PACK packet Take 10 g by mouth 2 (two) times daily for 7 days. 10/07/23 10/14/23  Chinita Pester, FNP      Allergies    Patient has no known allergies.    Review of Systems   Review of Systems  Constitutional:  Positive for fatigue.  Respiratory:  Positive for shortness of breath.   All other systems reviewed and are negative.   Physical Exam Updated Vital Signs BP (!) 150/99   Pulse 91   Temp 98.5  F (36.9 C) (Oral)   Resp 18   Ht 6\' 3"  (1.905 m)   Wt 131.5 kg   SpO2 100%   BMI 36.25 kg/m  Physical Exam Vitals and nursing note reviewed.  Constitutional:      Comments: Chronically ill-appearing  HENT:     Head: Normocephalic.     Nose: Nose normal.     Mouth/Throat:     Mouth: Mucous membranes are dry.  Eyes:     Extraocular Movements: Extraocular movements intact.     Pupils: Pupils are equal, round, and reactive to light.  Cardiovascular:     Rate and Rhythm: Normal rate and regular rhythm.     Pulses: Normal pulses.     Heart sounds: Normal heart sounds.  Pulmonary:     Comments: Diminished bilaterally Abdominal:     General: Abdomen is flat.      Palpations: Abdomen is soft.     Comments: No CVA tenderness and no abdominal tenderness  Musculoskeletal:        General: Normal range of motion.     Cervical back: Normal range of motion and neck supple.  Skin:    General: Skin is warm.     Capillary Refill: Capillary refill takes less than 2 seconds.  Neurological:     General: No focal deficit present.     Mental Status: He is oriented to person, place, and time.  Psychiatric:        Mood and Affect: Mood normal.        Behavior: Behavior normal.     ED Results / Procedures / Treatments   Labs (all labs ordered are listed, but only abnormal results are displayed) Labs Reviewed  CBC WITH DIFFERENTIAL/PLATELET - Abnormal; Notable for the following components:      Result Value   Hemoglobin 11.8 (*)    HCT 37.6 (*)    All other components within normal limits  COMPREHENSIVE METABOLIC PANEL - Abnormal; Notable for the following components:   Potassium 5.3 (*)    CO2 17 (*)    Glucose, Bld 104 (*)    BUN 102 (*)    Creatinine, Ser 12.45 (*)    Total Protein 9.0 (*)    AST 12 (*)    GFR, Estimated 5 (*)    Anion gap 16 (*)    All other components within normal limits  URINALYSIS, ROUTINE W REFLEX MICROSCOPIC - Abnormal; Notable for the following components:   Color, Urine AMBER (*)    APPearance CLOUDY (*)    Hgb urine dipstick MODERATE (*)    Protein, ur 100 (*)    Leukocytes,Ua MODERATE (*)    Bacteria, UA MANY (*)    Non Squamous Epithelial 0-5 (*)    All other components within normal limits  URINE CULTURE  LIPASE, BLOOD    EKG None  Radiology No results found.  Procedures Procedures    Medications Ordered in ED Medications  cefTRIAXone (ROCEPHIN) 1 g in sodium chloride 0.9 % 100 mL IVPB (1 g Intravenous New Bag/Given 10/11/23 1722)    ED Course/ Medical Decision Making/ A&P                                 Medical Decision Making Marc Neal is a 43 y.o. male here presenting with fatigue and  hematuria.  I reviewed patient's labs from previous and creatinine was 10 several days ago.  Plan to recheck kidney function and urinalysis  5:26 PM Creatinine is up to 12 now.  Potassium is 5.3.  UA showed UTI as well as blood in his urine.  Patient has no abdominal tenderness to suggest infected kidney stone.  I discussed case with Dr. Arrie Aran regarding starting dialysis.  Patient has had mature fistula on the right arm.  7:20 PM Dr. Abel Presto saw patient and recommend admission for dialysis tomorrow.  Hospitalist to admit for observation  Problems Addressed: ESRD (end stage renal disease) on dialysis [facility]): acute illness or injury  Amount and/or Complexity of Data Reviewed Labs: ordered. Decision-making details documented in ED Course.  Risk Decision regarding hospitalization.  Final Clinical Impression(s) / ED Diagnoses Final diagnoses:  None    Rx / DC Orders ED Discharge Orders     None         Charlynne Pander, MD 10/11/23 Jerene Bears

## 2023-10-11 NOTE — Consult Note (Signed)
 Reason for Consult: Initiation of dialysis  Referring Physician: Silverio Lay, MD  Marc Neal is an 43 y.o. 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 ([place] ED earlier today c/o 4 day history of flank pain and hematuria.  He presented with similar complaints on 10/07/23.  He also complained of a fever over the past few days and was given a prescription for cipro yesterday and took one dose last night.  He also c/o nausea, anorexia, dysgeusia, and malaise.   In the ED, Temp 98.2, Bp 149/97, HR 94, RR 17, SpO2 98%.  Labs notable for Hgb 11.8, K 5.3, Co2 17, BUN 102, Cr 12.45, Ca 9.6, alb 4.  UA cloudy, moderate blood, moderate leukocytes, many bacteria, + prot.  He is being admitted for further evaluation and we were consulted to initiate dialysis given his worsening uremic symptoms.  He denies any lower extremity edema, SOB, orthopnea, or PND.  Trend in Creatinine: Creatinine, Ser  Date/Time Value Ref Range Status  10/11/2023 12:28 PM 12.45 (H) 0.61 - 1.24 mg/dL Final  16/04/9603 54:09 PM 10.05 (H) 0.61 - 1.24 mg/dL Final  81/19/1478 29:56 PM 5.09 (H) 0.61 - 1.24 mg/dL Final  21/30/8657 84:69 AM 5.56 (H) 0.61 - 1.24 mg/dL Final  62/95/2841 32:44 AM 3.54 (H) 0.61 - 1.24 mg/dL Final  07/26/7251 66:44 AM 3.63 (H) 0.61 - 1.24 mg/dL Final  03/47/4259 56:38 AM 1.90 (H) 0.61 - 1.24 mg/dL Final  75/64/3329 51:88 PM 2.20 (H) 0.61 - 1.24 mg/dL Final    PMH:   Past Medical History:  Diagnosis Date   Gout    HTN (hypertension)    Obesity    Polycystic kidney     PSH:   Past Surgical History:  Procedure Laterality Date   AV FISTULA PLACEMENT Right    KNEE SURGERY     NO PAST SURGERIES      Allergies: No Known Allergies  Medications:   Prior to Admission medications   Medication Sig Start Date End Date Taking? Authorizing Provider  allopurinol (ZYLOPRIM) 100 MG tablet Take 100 mg by mouth daily.    [provider]  amLODipine (NORVASC) 10 MG tablet Take 1 tablet (10 mg total) by mouth daily. 01/25/17   Auburn Bilberry, MD  calcitRIOL (ROCALTROL) 0.25 MCG capsule Take by mouth. 11/29/20   [provider]  cholecalciferol (VITAMIN D) 1000 units tablet Take 2,000 Units by mouth daily.    [provider]  ferrous sulfate 324 MG TBEC Take 324 mg by mouth 3 (three) times a week.    [provider]  hydrALAZINE (APRESOLINE) 10 MG tablet Take 1 tablet by mouth 4 (four) times daily. 11/26/20   [provider]  labetalol (NORMODYNE) 300 MG tablet Take 1 tablet (300 mg total) by mouth 2 (two) times daily. 01/24/17   Auburn Bilberry, MD  lisinopril (PRINIVIL,ZESTRIL) 40 MG tablet Take 1 tablet (40 mg total) by mouth daily. Patient not taking: Reported on 12/24/2020 01/25/17   Auburn Bilberry, MD  omeprazole (PRILOSEC) 40 MG capsule Take by mouth. 03/04/20   [provider]  ondansetron (ZOFRAN) 4 MG tablet Take 1 tablet (4 mg total) by mouth every 8 (eight) hours as needed. Patient not taking: Reported on 12/15/2019 11/09/19   Phineas Semen, MD  ondansetron (ZOFRAN) 4 MG tablet Take 1 tablet (4 mg total) by mouth every 8 (eight) hours as needed. Patient  not taking: Reported on 12/24/2020 12/06/20   Phineas Semen, MD  sodium zirconium cyclosilicate (LOKELMA) 10 g PACK packet Take 10 g by mouth 2 (two) times daily for 7 days. 10/07/23 10/14/23  Chinita Pester, FNP    Inpatient medications:   Discontinued Meds:  There are no discontinued medications.  Social History:  reports that he has never smoked. He has never used smokeless tobacco. He reports current alcohol use. He reports that he does not use drugs.  Family History:   Family History  Problem Relation Age of Onset   Obesity Other     Pertinent items are noted in HPI. Weight change:  No intake or output data in the 24 hours ending 10/11/23 1706 BP (!) 150/99   Pulse 91   Temp 98.5 F (36.9 C) (Oral)    Resp 18   Ht 6\' 3"  (1.905 m)   Wt 131.5 kg   SpO2 100%   BMI 36.25 kg/m  Vitals:   10/11/23 1034 10/11/23 1214 10/11/23 1536  BP: (!) 149/97  (!) 150/99  Pulse: 94  91  Resp: 17  18  Temp: 98.2 F (36.8 C)  98.5 F (36.9 C)  TempSrc: Oral  Oral  SpO2: 98%  100%  Weight:  131.5 kg   Height:  6\' 3"  (1.905 m)      General appearance: alert, cooperative, and no distress Head: Normocephalic, without obvious abnormality, atraumatic Eyes: negative findings: lids and lashes normal, conjunctivae and sclerae normal, and corneas clear Resp: clear to auscultation bilaterally Cardio: regular rate and rhythm, S1, S2 normal, no murmur, click, rub or gallop GI: soft, non-tender; bowel sounds normal; no masses,  no organomegaly Extremities: extremities normal, atraumatic, no cyanosis or edema and right forearm AVF +T/B and well developed  Labs: Basic Metabolic Panel: Recent Labs  Lab 10/07/23 1850 10/11/23 1228  NA 137 137  K 5.3* 5.3*  CL 108 104  CO2 15* 17*  GLUCOSE 100* 104*  BUN 87* 102*  CREATININE 10.05* 12.45*  ALBUMIN 4.0 4.0  CALCIUM 8.8* 9.6   Liver Function Tests: Recent Labs  Lab 10/07/23 1850 10/11/23 1228  AST 13* 12*  ALT 12 10  ALKPHOS 47 45  BILITOT 0.7 0.7  PROT 7.8 9.0*  ALBUMIN 4.0 4.0   Recent Labs  Lab 10/07/23 1850 10/11/23 1228  LIPASE 27 34   No results for input(s): "AMMONIA" in the last 168 hours. CBC: Recent Labs  Lab 10/07/23 1850 10/11/23 1228  WBC 9.8 8.3  NEUTROABS 8.3* 6.1  HGB 10.9* 11.8*  HCT 34.0* 37.6*  MCV 88.8 88.5  PLT 166 256   PT/INR: @LABRCNTIP (inr:5) Cardiac Enzymes: )No results for input(s): "CKTOTAL", "CKMB", "CKMBINDEX", "TROPONINI" in the last 168 hours. CBG: No results for input(s): "GLUCAP" in the last 168 hours.  Iron Studies: No results for input(s): "IRON", "TIBC", "TRANSFERRIN", "FERRITIN" in the last 168 hours.  Xrays/Other Studies: No results found.   Assessment/Plan:  Flank pain with  hematuria - likely had a ruptured cyst vs infection.  He was started on cipro yesterday.  Plan per primary  New ESRD - given worsening uremic symptoms will plan for his first HD session tomorrow morning.  He is set up for outpatient HD at [facility] MWF to start 10/15/23.  Will evaluate for possible second HD session on Sat. HTN - stable.  Continue with home meds. 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 Anemia of CKD stage V - Hgb stable. Hyperkalemia -  continue with Lokelma until he starts dialysis.   Julien Nordmann Kindle Strohmeier 10/11/2023, 5:06 PM

## 2023-10-11 NOTE — ED Provider Triage Note (Signed)
 Emergency Medicine Provider Triage Evaluation Note  Marc Neal , a 43 y.o. male  was evaluated in triage.  Pt complains of hematuria. Pt with stage V kidney disease.  He notice L flank pain and hematuria x 4-5 days.  Has been seen at 2 different facilities for same and his Cr is climbing.  Still having discomfort.  May need nephrology to weigh in.  No hx of kidney stone.  Some nausea, no fever, chills, cough  Review of Systems  Positive: As above Negative: As above  Physical Exam  BP (!) 149/97   Pulse 94   Temp 98.2 F (36.8 C) (Oral)   Resp 17   Ht 6\' 3"  (1.905 m)   Wt 131.5 kg   SpO2 98%   BMI 36.25 kg/m  Gen:   Awake, no distress   Resp:  Normal effort  MSK:   Moves extremities without difficulty  Other:    Medical Decision Making  Medically screening exam initiated at 12:28 PM.  Appropriate orders placed.  Marc Neal was informed that the remainder of the evaluation will be completed by another provider, this initial triage assessment does not replace that evaluation, and the importance of remaining in the ED until their evaluation is complete.     Fayrene Helper, PA-C 10/11/23 1229

## 2023-10-11 NOTE — Progress Notes (Signed)
 VAST consult for PIV. Arrived to unit. Patient currently has PIV placed. Tomasita Morrow, RN VAST

## 2023-10-12 DIAGNOSIS — D631 Anemia in chronic kidney disease: Secondary | ICD-10-CM | POA: Diagnosis present

## 2023-10-12 DIAGNOSIS — R310 Gross hematuria: Secondary | ICD-10-CM | POA: Diagnosis present

## 2023-10-12 DIAGNOSIS — N186 End stage renal disease: Secondary | ICD-10-CM

## 2023-10-12 DIAGNOSIS — E875 Hyperkalemia: Secondary | ICD-10-CM | POA: Diagnosis present

## 2023-10-12 DIAGNOSIS — Q613 Polycystic kidney, unspecified: Secondary | ICD-10-CM | POA: Diagnosis not present

## 2023-10-12 DIAGNOSIS — E669 Obesity, unspecified: Secondary | ICD-10-CM | POA: Diagnosis present

## 2023-10-12 DIAGNOSIS — M109 Gout, unspecified: Secondary | ICD-10-CM | POA: Diagnosis present

## 2023-10-12 DIAGNOSIS — Z79899 Other long term (current) drug therapy: Secondary | ICD-10-CM | POA: Diagnosis not present

## 2023-10-12 DIAGNOSIS — Z992 Dependence on renal dialysis: Secondary | ICD-10-CM | POA: Diagnosis not present

## 2023-10-12 DIAGNOSIS — Z6836 Body mass index (BMI) 36.0-36.9, adult: Secondary | ICD-10-CM | POA: Diagnosis not present

## 2023-10-12 DIAGNOSIS — I151 Hypertension secondary to other renal disorders: Secondary | ICD-10-CM | POA: Diagnosis present

## 2023-10-12 LAB — CBC
HCT: 32.2 % — ABNORMAL LOW (ref 39.0–52.0)
Hemoglobin: 10.8 g/dL — ABNORMAL LOW (ref 13.0–17.0)
MCH: 28.6 pg (ref 26.0–34.0)
MCHC: 33.5 g/dL (ref 30.0–36.0)
MCV: 85.4 fL (ref 80.0–100.0)
Platelets: 220 10*3/uL (ref 150–400)
RBC: 3.77 MIL/uL — ABNORMAL LOW (ref 4.22–5.81)
RDW: 12.4 % (ref 11.5–15.5)
WBC: 8 10*3/uL (ref 4.0–10.5)
nRBC: 0 % (ref 0.0–0.2)

## 2023-10-12 LAB — BASIC METABOLIC PANEL
Anion gap: 16 — ABNORMAL HIGH (ref 5–15)
BUN: 111 mg/dL — ABNORMAL HIGH (ref 6–20)
CO2: 15 mmol/L — ABNORMAL LOW (ref 22–32)
Calcium: 9 mg/dL (ref 8.9–10.3)
Chloride: 105 mmol/L (ref 98–111)
Creatinine, Ser: 12.5 mg/dL — ABNORMAL HIGH (ref 0.61–1.24)
GFR, Estimated: 5 mL/min — ABNORMAL LOW (ref >60)
Glucose, Bld: 151 mg/dL — ABNORMAL HIGH (ref 70–99)
Potassium: 4.9 mmol/L (ref 3.5–5.1)
Sodium: 136 mmol/L (ref 135–145)

## 2023-10-12 LAB — HIV ANTIBODY (ROUTINE TESTING W REFLEX): HIV Screen 4th Generation wRfx: NONREACTIVE

## 2023-10-12 MED ORDER — AMLODIPINE BESYLATE 10 MG PO TABS
10.0000 mg | ORAL_TABLET | Freq: Every day | ORAL | Status: DC
  Administered 2023-10-12 – 2023-10-13 (×2): 10 mg via ORAL
  Filled 2023-10-12 (×2): qty 1

## 2023-10-12 MED ORDER — ALLOPURINOL 100 MG PO TABS
100.0000 mg | ORAL_TABLET | Freq: Every day | ORAL | Status: DC
  Administered 2023-10-12 – 2023-10-13 (×2): 100 mg via ORAL
  Filled 2023-10-12 (×2): qty 1

## 2023-10-12 MED ORDER — HEPARIN SODIUM (PORCINE) 1000 UNIT/ML DIALYSIS: 1000.0000 [IU] | INTRAMUSCULAR | Status: DC | PRN

## 2023-10-12 MED ORDER — TRAZODONE HCL 50 MG PO TABS: 25.0000 mg | ORAL_TABLET | Freq: Every evening | ORAL | Status: DC | PRN

## 2023-10-12 MED ORDER — NEPRO/CARBSTEADY PO LIQD: 237.0000 mL | ORAL | Status: DC | PRN

## 2023-10-12 MED ORDER — HYDRALAZINE HCL 50 MG PO TABS
50.0000 mg | ORAL_TABLET | Freq: Every day | ORAL | Status: DC
  Administered 2023-10-12 – 2023-10-13 (×2): 50 mg via ORAL
  Filled 2023-10-12 (×2): qty 1

## 2023-10-12 MED ORDER — CALCITRIOL 0.25 MCG PO CAPS
0.2500 ug | ORAL_CAPSULE | ORAL | Status: DC
  Administered 2023-10-12: 0.25 ug via ORAL
  Filled 2023-10-12: qty 1

## 2023-10-12 MED ORDER — LIDOCAINE-PRILOCAINE 2.5-2.5 % EX CREA: 1.0000 | TOPICAL_CREAM | CUTANEOUS | Status: DC | PRN

## 2023-10-12 MED ORDER — ANTICOAGULANT SODIUM CITRATE 4% (200MG/5ML) IV SOLN: 5.0000 mL | Status: DC | PRN

## 2023-10-12 MED ORDER — PENTAFLUOROPROP-TETRAFLUOROETH EX AERO: 1.0000 | INHALATION_SPRAY | CUTANEOUS | Status: DC | PRN

## 2023-10-12 MED ORDER — LABETALOL HCL 200 MG PO TABS
300.0000 mg | ORAL_TABLET | Freq: Every day | ORAL | Status: DC
  Administered 2023-10-12 – 2023-10-13 (×2): 300 mg via ORAL
  Filled 2023-10-12 (×2): qty 2

## 2023-10-12 MED ORDER — LIDOCAINE HCL (PF) 1 % IJ SOLN: 5.0000 mL | INTRAMUSCULAR | Status: DC | PRN

## 2023-10-12 MED ORDER — OXYCODONE HCL 5 MG PO TABS
5.0000 mg | ORAL_TABLET | Freq: Four times a day (QID) | ORAL | Status: DC | PRN
  Administered 2023-10-12: 5 mg via ORAL
  Filled 2023-10-12 (×2): qty 1

## 2023-10-12 MED ORDER — PANTOPRAZOLE SODIUM 40 MG PO TBEC
40.0000 mg | DELAYED_RELEASE_TABLET | Freq: Every day | ORAL | Status: DC
  Administered 2023-10-12 – 2023-10-13 (×2): 40 mg via ORAL
  Filled 2023-10-12 (×2): qty 1

## 2023-10-12 MED ORDER — MELATONIN 3 MG PO TABS: 3.0000 mg | ORAL_TABLET | Freq: Every evening | ORAL | Status: DC | PRN

## 2023-10-12 MED ORDER — ALTEPLASE 2 MG IJ SOLR: 2.0000 mg | Freq: Once | INTRAMUSCULAR | Status: DC | PRN

## 2023-10-12 NOTE — Progress Notes (Signed)
 Received patient in bed to unit. 2 Alert and oriented. X4 Informed consent signed and in chart.   TX duration: 2.5  Patient tolerated well.  Transported back to the room  Alert, without acute distress.  Hand-off given to patient's nurse. Sharia Reeve RN  Access used: Right Fistula Access issues: Clotted during blood return  Total UF removed: 500 ml Medication(s) given: None Post HD weight: 126.8 kg Post HD VS: 144/93, 108 MAP.,20 Resp, 99 O2 , 104 pulse , Temp 99.1 Oral   Geet Hosking [facility]

## 2023-10-12 NOTE — Progress Notes (Signed)
 PROGRESS NOTE  Marc Neal  DOB: January 12, 1981  PCP: [facility] WVP:[id number]  DOA: 10/11/2023  LOS: 0 days  Hospital Day: 2  Brief narrative: Marc Neal is a 43 y.o. 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  Patient has a fistula in placed in anticipation of dialysis 3 years ago.  4 days ago the patient woke up and felt terrible.  He had right flank pain and started urinating blood.  He also started cramping a lot and felt fatigued and winded even after short walks.  Also reported low-grade fever. Was seen by nephrology as an outpatient and started on oral ciprofloxacin 3 days ago for UTI.  Preparations were made to start him on dialysis starting 3/24.Marland Kitchen  He was told that if he got worse before an outpatient dialysis site could be found that he should come to the ER.   2 days later he continued to feel really poor, continued to have some frank bleeding with urination so he presented to the ED on 3/20.     In the ED, patient was afebrile, Labs with BUN 102, creatinine 12.45 Urinalysis showed cloudy urine with moderate blood, moderate leukocytes, many bacteria. Patient was started on IV Rocephin Nephrology was consulted  Subjective: Patient was seen and examined this morning at hemodialysis. Met with his mother at bedside in the room. Not in distress.  No new symptoms.  Assessment and plan: Flank pain with hematuria Likely due to a ruptured cyst versus infection. Given moderate leukocytes in the urinalysis, he was started on IV Rocephin which I will continue for now.  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  Outpatient dialysis initiation was planned but felt worsened came to the ED. Had first session of hemodialysis this morning.  Hypertension Controlled on amlodipine, hydralazine,  labetalol  Anemia of ESRD Mild.  Stable. Recent Labs    10/07/23 1850 10/11/23 1228 10/12/23 0751  HGB 10.9* 11.8* 10.8*  MCV 88.8 88.5 85.4   Hyperkalemia Given Lokelma prior to dialysis Recent Labs  Lab 10/07/23 1850 10/11/23 1228 10/12/23 0751  K 5.3* 5.3* 4.9   Mobility: Encourage ambulation  Goals of care   Code Status: Full Code     DVT prophylaxis:  heparin injection 5,000 Units Start: 10/12/23 0600   Antimicrobials: IV Rocephin Fluid: None Consultants: Neurology Family Communication: Mom at bedside  Status: Observation Level of care:  Telemetry Medical   Patient is from: Home Needs to continue in-hospital care: Initiated on dialysis Anticipated d/c to: Pending clinical course,     Diet:  Diet Order             Diet renal with fluid restriction Fluid restriction: 1200 mL Fluid; Room service appropriate? Yes; Fluid consistency: Thin  Diet effective now                   Scheduled Meds:  allopurinol  100 mg Oral Daily   amLODipine  10 mg Oral Daily   calcitRIOL  0.25 mcg Oral Once per day on Monday Wednesday Friday   Chlorhexidine Gluconate Cloth  6 each Topical Q0600   heparin  5,000 Units Subcutaneous Q8H   hydrALAZINE  50 mg Oral Daily   labetalol  300 mg Oral Daily   pantoprazole  40 mg Oral Daily    PRN  meds: melatonin, ondansetron **OR** ondansetron (ZOFRAN) IV, oxyCODONE, traZODone   Infusions:   cefTRIAXone (ROCEPHIN)  IV 1 g (10/12/23 0523)    Antimicrobials: Anti-infectives (From admission, onward)    Start     Dose/Rate Route Frequency Ordered Stop   10/12/23 0500  cefTRIAXone (ROCEPHIN) 1 g in sodium chloride 0.9 % 100 mL IVPB        1 g 200 mL/hr over 30 Minutes Intravenous Every 24 hours 10/11/23 2236     10/11/23 1630  cefTRIAXone (ROCEPHIN) 1 g in sodium chloride 0.9 % 100 mL IVPB        1 g 200 mL/hr over 30 Minutes Intravenous  Once 10/11/23 1620 10/11/23 1754       Objective: Vitals:   10/12/23 1216  10/12/23 1325  BP: (!) 144/93 (!) 144/100  Pulse: (!) 104 (!) 101  Resp: 20   Temp: 99.1 F (37.3 C)   SpO2: 97% 100%    Intake/Output Summary (Last 24 hours) at 10/12/2023 1442 Last data filed at 10/12/2023 1216 Gross per 24 hour  Intake 320.39 ml  Output 0.5 ml  Net 319.89 ml   Filed Weights   10/11/23 1214 10/11/23 2000 10/12/23 1216  Weight: 131.5 kg 126.1 kg 126.8 kg   Weight change:  Body mass index is 34.94 kg/m.   Physical Exam: General exam: Pleasant, young African-American male.  Not in pain Skin: No rashes, lesions or ulcers. HEENT: Atraumatic, normocephalic, no obvious bleeding Lungs: Clear to auscultation bilaterally,  CVS: S1, S2, no murmur,   GI/Abd: Soft, nontender, nondistended, bowel sound present,   CNS: Alert, awake, oriented x 3 Psychiatry: Sad affect Extremities: No pedal edema, no calf tenderness,   Data Review: I have personally reviewed the laboratory data and studies available.  F/u labs  Unresulted Labs (From admission, onward)     Start     Ordered   10/11/23 1743  Hepatitis B surface antibody,quantitative  (New Admission Hemo Labs (Hepatitis B))  ONCE - URGENT,   URGENT        10/11/23 1744   10/11/23 1620  Urine Culture  Once,   URGENT       Question:  Indication  Answer:  Dysuria   10/11/23 1619            Total time spent in review of labs and imaging, patient evaluation, formulation of plan, documentation and communication with family: 55 minutes  Signed, Lorin Glass, MD [facility] 10/12/2023

## 2023-10-12 NOTE — Progress Notes (Signed)
 Marc Neal is an 43 y.o. 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  He presented with similar complaints on 10/07/23.  Given a prescription for cipro. In the ED, Temp 98.2, Bp 149/97, HR 94, RR 17, SpO2 98%.  Labs notable for Hgb 11.8, K 5.3, Co2 17, BUN 102, Cr 12.45, Ca 9.6, alb 4.  UA cloudy, moderate blood, moderate leukocytes, many bacteria, + prot.    Assessment/Plan:  Flank pain with hematuria - likely had a ruptured cyst vs infection.  He was started on cipro 3/19.  Plan per primary  New ESRD - given worsening uremic symptoms will plan for his first HD session today. Seen on dialysis through the right forearm radiocephalic fistula with stable vitals and tolerating ultrafiltration, no changes  He is set up for outpatient HD at [facility] MWF to start 10/15/23.  Will evaluate for possible second HD session on Sat he is already looking much better.  Should be able to be discharged either today or tomorrow with next dialysis treatment at his outpatient center  HTN - stable.  Continue with home meds. 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   Anemia of CKD stage V - Hgb stable. Hyperkalemia - continue with Lokelma until he starts dialysis.  Subjective: Is feeling much better; denies any fever chills nausea vomiting.   Chemistry and CBC: Creatinine, Ser  Date/Time Value Ref Range Status  10/12/2023 07:51 AM 12.50 (H) 0.61 - 1.24 mg/dL Final  19/14/7829 56:21 PM 12.45 (H) 0.61 - 1.24 mg/dL Final  30/86/5784 69:62 PM 10.05 (H) 0.61 - 1.24 mg/dL Final  95/28/4132 44:01 PM 5.09 (H) 0.61 - 1.24 mg/dL Final  02/72/5366 44:03 AM 5.56 (H) 0.61 - 1.24 mg/dL Final  47/42/5956 38:75 AM 3.54 (H) 0.61 - 1.24 mg/dL Final  64/33/2951 88:41 AM 3.63 (H) 0.61 - 1.24 mg/dL Final  66/12/3014 01:09 AM 1.90 (H) 0.61 - 1.24 mg/dL Final  32/35/5732  20:25 PM 2.20 (H) 0.61 - 1.24 mg/dL Final   Recent Labs  Lab 10/07/23 1850 10/11/23 1228 10/12/23 0751  NA 137 137 136  K 5.3* 5.3* 4.9  CL 108 104 105  CO2 15* 17* 15*  GLUCOSE 100* 104* 151*  BUN 87* 102* 111*  CREATININE 10.05* 12.45* 12.50*  CALCIUM 8.8* 9.6 9.0   Recent Labs  Lab 10/07/23 1850 10/11/23 1228 10/12/23 0751  WBC 9.8 8.3 8.0  NEUTROABS 8.3* 6.1  --   HGB 10.9* 11.8* 10.8*  HCT 34.0* 37.6* 32.2*  MCV 88.8 88.5 85.4  PLT 166 256 220   Liver Function Tests: Recent Labs  Lab 10/07/23 1850 10/11/23 1228  AST 13* 12*  ALT 12 10  ALKPHOS 47 45  BILITOT 0.7 0.7  PROT 7.8 9.0*  ALBUMIN 4.0 4.0   Recent Labs  Lab 10/07/23 1850 10/11/23 1228  LIPASE 27 34   No results for input(s): "AMMONIA" in the last 168 hours. Cardiac Enzymes: No results for input(s): "CKTOTAL", "CKMB", "CKMBINDEX", "TROPONINI" in the last 168 hours. Iron Studies: No results for input(s): "IRON", "TIBC", "TRANSFERRIN", "FERRITIN" in the last 72 hours. PT/INR: @LABRCNTIP (inr:5)  Xrays/Other Studies: ) Results for orders placed or performed during the hospital encounter of 10/11/23 (from the past 48 hours)  CBC with Differential     Status: Abnormal   Collection Time:  10/11/23 12:28 PM  Result Value Ref Range   WBC 8.3 4.0 - 10.5 K/uL   RBC 4.25 4.22 - 5.81 MIL/uL   Hemoglobin 11.8 (L) 13.0 - 17.0 g/dL   HCT 81.1 (L) 91.4 - 78.2 %   MCV 88.5 80.0 - 100.0 fL   MCH 27.8 26.0 - 34.0 pg   MCHC 31.4 30.0 - 36.0 g/dL   RDW 95.6 21.3 - 08.6 %   Platelets 256 150 - 400 K/uL   nRBC 0.0 0.0 - 0.2 %   Neutrophils Relative % 73 %   Neutro Abs 6.1 1.7 - 7.7 K/uL   Lymphocytes Relative 16 %   Lymphs Abs 1.3 0.7 - 4.0 K/uL   Monocytes Relative 9 %   Monocytes Absolute 0.7 0.1 - 1.0 K/uL   Eosinophils Relative 1 %   Eosinophils Absolute 0.1 0.0 - 0.5 K/uL   Basophils Relative 1 %   Basophils Absolute 0.0 0.0 - 0.1 K/uL   Immature Granulocytes 0 %   Abs Immature Granulocytes  0.02 0.00 - 0.07 K/uL    Comment: Performed at [facility], [address], [place], [place] [address]  Comprehensive metabolic panel     Status: Abnormal   Collection Time: 10/11/23 12:28 PM  Result Value Ref Range   Sodium 137 135 - 145 mmol/L   Potassium 5.3 (H) 3.5 - 5.1 mmol/L   Chloride 104 98 - 111 mmol/L   CO2 17 (L) 22 - 32 mmol/L   Glucose, Bld 104 (H) 70 - 99 mg/dL    Comment: Glucose reference range applies only to samples taken after fasting for at least 8 hours.   BUN 102 (H) 6 - 20 mg/dL   Creatinine, Ser 96.29 (H) 0.61 - 1.24 mg/dL   Calcium 9.6 8.9 - 52.8 mg/dL   Total Protein 9.0 (H) 6.5 - 8.1 g/dL   Albumin 4.0 3.5 - 5.0 g/dL   AST 12 (L) 15 - 41 U/L   ALT 10 0 - 44 U/L   Alkaline Phosphatase 45 38 - 126 U/L   Total Bilirubin 0.7 0.0 - 1.2 mg/dL   GFR, Estimated 5 (L) >60 mL/min    Comment: (NOTE) Calculated using the CKD-EPI Creatinine Equation (2021)    Anion gap 16 (H) 5 - 15    Comment: Performed at [facility], [address], [place], [place] [address]  Lipase, blood     Status: None   Collection Time: 10/11/23 12:28 PM  Result Value Ref Range   Lipase 34 11 - 51 U/L    Comment: Performed at [facility], [address] [place], [place] [address]  Urinalysis, Routine w reflex microscopic -Urine, Clean Catch     Status: Abnormal   Collection Time: 10/11/23 12:28 PM  Result Value Ref Range   Color, Urine AMBER (A) YELLOW    Comment: BIOCHEMICALS MAY BE AFFECTED BY COLOR   APPearance CLOUDY (A) CLEAR   Specific Gravity, Urine 1.010 1.005 - 1.030   pH 5.0 5.0 - 8.0   Glucose, UA NEGATIVE NEGATIVE mg/dL   Hgb urine dipstick MODERATE (A) NEGATIVE   Bilirubin Urine NEGATIVE NEGATIVE   Ketones, ur NEGATIVE NEGATIVE mg/dL   Protein, ur 725 (A) NEGATIVE mg/dL   Nitrite NEGATIVE NEGATIVE   Leukocytes,Ua MODERATE (A) NEGATIVE   RBC / HPF >50 0 - 5 RBC/hpf   WBC, UA >50 0 - 5 WBC/hpf   Bacteria, UA MANY (A) NONE SEEN  Squamous  Epithelial / HPF 0-5 0 - 5 /HPF   WBC Clumps PRESENT    Mucus PRESENT    Non Squamous Epithelial 0-5 (A) NONE SEEN    Comment: Performed at [facility], [address] [place], [place] [address]  Hepatitis B surface antigen     Status: None   Collection Time: 10/11/23  8:05 PM  Result Value Ref Range   Hepatitis B Surface Ag NON REACTIVE NON REACTIVE    Comment: Performed at [facility], [address], [place], [place] [address]  HIV Antibody (routine testing w rflx)     Status: None   Collection Time: 10/12/23  5:43 AM  Result Value Ref Range   HIV Screen 4th Generation wRfx Non Reactive Non Reactive    Comment: Performed at [facility], [address] [place], [place] [address]  CBC     Status: Abnormal   Collection Time: 10/12/23  7:51 AM  Result Value Ref Range   WBC 8.0 4.0 - 10.5 K/uL   RBC 3.77 (L) 4.22 - 5.81 MIL/uL   Hemoglobin 10.8 (L) 13.0 - 17.0 g/dL    Comment: REPEATED TO VERIFY RESULTS VERIFIED VIA RECOLLECT    HCT 32.2 (L) 39.0 - 52.0 %    Comment: RESULTS VERIFIED VIA RECOLLECT   MCV 85.4 80.0 - 100.0 fL   MCH 28.6 26.0 - 34.0 pg   MCHC 33.5 30.0 - 36.0 g/dL   RDW 21.3 08.6 - 57.8 %   Platelets 220 150 - 400 K/uL    Comment: RESULTS VERIFIED VIA RECOLLECT   nRBC 0.0 0.0 - 0.2 %    Comment: Performed at [facility], [address], [place], [place] [address]  Basic metabolic panel     Status: Abnormal   Collection Time: 10/12/23  7:51 AM  Result Value Ref Range   Sodium 136 135 - 145 mmol/L   Potassium 4.9 3.5 - 5.1 mmol/L   Chloride 105 98 - 111 mmol/L   CO2 15 (L) 22 - 32 mmol/L   Glucose, Bld 151 (H) 70 - 99 mg/dL    Comment: Glucose reference range applies only to samples taken after fasting for at least 8 hours.   BUN 111 (H) 6 - 20 mg/dL   Creatinine, Ser 95.28 (H) 0.61 - 1.24 mg/dL   Calcium 9.0 8.9 - 41.3 mg/dL   GFR, Estimated 5 (L) >60 mL/min    Comment: (NOTE) Calculated using the CKD-EPI Creatinine Equation  (2021)    Anion gap 16 (H) 5 - 15    Comment: Performed at [facility], [address] [place], [place] [address]   No results found.  PMH:   Past Medical History:  Diagnosis Date   Gout    HTN (hypertension)    Obesity    Polycystic kidney     PSH:   Past Surgical History:  Procedure Laterality Date   AV FISTULA PLACEMENT Right    KNEE SURGERY     NO PAST SURGERIES      Allergies: No Known Allergies  Medications:   Prior to Admission medications   Medication Sig Start Date End Date Taking? Authorizing Provider  allopurinol (ZYLOPRIM) 100 MG tablet Take 100 mg by mouth daily.   Yes [provider]  amLODipine (NORVASC) 10 MG tablet Take 1 tablet (10 mg total) by mouth daily. 01/25/17  Yes Auburn Bilberry, MD  calcitRIOL (ROCALTROL) 0.25 MCG capsule Take 0.25  mcg by mouth 3 (three) times a week. Mondays, Wednesdays, Fridays 11/29/20  Yes [provider]  cholecalciferol (VITAMIN D) 1000 units tablet Take 2,000 Units by mouth daily.   Yes [provider]  ciprofloxacin (CIPRO) 500 MG tablet Take 500 mg by mouth daily with breakfast. 10/09/23  Yes [provider]  ferrous sulfate 324 MG TBEC Take 324 mg by mouth daily with breakfast.   Yes [provider]  hydrALAZINE (APRESOLINE) 50 MG tablet Take 50 mg by mouth daily. 11/26/20  Yes [provider]  labetalol (NORMODYNE) 300 MG tablet Take 1 tablet (300 mg total) by mouth 2 (two) times daily. Patient taking differently: Take 300 mg by mouth daily. 01/24/17  Yes Auburn Bilberry, MD  omeprazole (PRILOSEC) 20 MG capsule Take 20 mg by mouth daily. 03/04/20  Yes [provider]  oxyCODONE (OXY IR/ROXICODONE) 5 MG immediate release tablet Take 5 mg by mouth every 6 (six) hours as needed for severe pain (pain score 7-10). 10/07/23  Yes [provider]  traZODone (DESYREL) 50 MG tablet Take 25 mg by mouth at bedtime as needed for sleep.   Yes [provider]   sodium zirconium cyclosilicate (LOKELMA) 10 g PACK packet Take 10 g by mouth 2 (two) times daily for 7 days. Patient not taking: Reported on 10/11/2023 10/07/23 10/14/23  Chinita Pester, FNP    Discontinued Meds:   Medications Discontinued During This Encounter  Medication Reason   lisinopril (PRINIVIL,ZESTRIL) 40 MG tablet Discontinued by provider   ondansetron (ZOFRAN) 4 MG tablet No longer needed (for PRN medications)   ondansetron (ZOFRAN) 4 MG tablet No longer needed (for PRN medications)   melatonin tablet 3 mg     Social History:  reports that he has never smoked. He has never used smokeless tobacco. He reports current alcohol use. He reports that he does not use drugs.  Family History:   Family History  Problem Relation Age of Onset   Obesity Other     Blood pressure (!) 148/101, pulse 91, temperature 99 F (37.2 C), resp. rate 16, height 6\' 3"  (1.905 m), weight 126.1 kg, SpO2 100%. Physical Exam: General appearance: NCAT  Eyes: negative findings Resp: CTA b/l Cardio: RRR GI: soft, non-tender Extremities: extremities normal Access: right forearm AVF +T/B and well developed       Ethelene Hal, MD 10/12/2023, 10:14 AM

## 2023-10-12 NOTE — Progress Notes (Signed)
 Pt has been accepted at [facility] on MWF 11:55 am chair time. Pt can start on Monday if d/c this weekend. Pt will need to arrive at 10:45 am for first appt to complete paperwork prior to treatment. Met with pt and family at bedside to confirm pt is aware of this info. Pt agreeable to plans and schedule letter provided to pt. Arrangements added to AVS as well. Update provided to nephrologist and renal PA. Clinic aware pt is hospitalized and that navigator will contact clinic on Monday morning as to if pt was d/c and will be starting on Monday. Renal PA aware of clinic's need for orders. Update provided to RN CM as well. Will assist as needed.   Olivia Canter Renal Navigator [phone]

## 2023-10-12 NOTE — Plan of Care (Signed)
  Problem: Education: Goal: Knowledge of General Education information will improve Description: Including pain rating scale, medication(s)/side effects and non-pharmacologic comfort measures Outcome: Progressing   Problem: Clinical Measurements: Goal: Ability to maintain clinical measurements within normal limits will improve Outcome: Progressing Goal: Diagnostic test results will improve Outcome: Progressing Goal: Respiratory complications will improve Outcome: Progressing Goal: Cardiovascular complication will be avoided Outcome: Progressing   Problem: Activity: Goal: Risk for activity intolerance will decrease Outcome: Progressing   Problem: Nutrition: Goal: Adequate nutrition will be maintained Outcome: Progressing   Problem: Elimination: Goal: Will not experience complications related to bowel motility Outcome: Progressing Goal: Will not experience complications related to urinary retention Outcome: Progressing   Problem: Pain Managment: Goal: General experience of comfort will improve and/or be controlled Outcome: Progressing   Problem: Safety: Goal: Ability to remain free from injury will improve Outcome: Progressing   Problem: Skin Integrity: Goal: Risk for impaired skin integrity will decrease Outcome: Progressing   Problem: Fluid Volume: Goal: Compliance with measures to maintain balanced fluid volume will improve Outcome: Progressing   Problem: Health Behavior/Discharge Planning: Goal: Ability to manage health-related needs will improve Outcome: Progressing   Problem: Nutritional: Goal: Ability to make healthy dietary choices will improve Outcome: Progressing   Problem: Clinical Measurements: Goal: Complications related to the disease process, condition or treatment will be avoided or minimized Outcome: Progressing

## 2023-10-13 ENCOUNTER — Other Ambulatory Visit (HOSPITAL_COMMUNITY): Payer: Self-pay

## 2023-10-13 DIAGNOSIS — N186 End stage renal disease: Secondary | ICD-10-CM | POA: Diagnosis not present

## 2023-10-13 LAB — HEPATITIS B SURFACE ANTIBODY, QUANTITATIVE: Hep B S AB Quant (Post): 109 m[IU]/mL

## 2023-10-13 LAB — URINE CULTURE: Culture: <10000 — AB

## 2023-10-13 MED ORDER — CEPHALEXIN 250 MG PO CAPS
250.0000 mg | ORAL_CAPSULE | Freq: Every day | ORAL | 0 refills | Status: AC
  Filled 2023-10-13: qty 3, 3d supply, fill #0

## 2023-10-13 MED ORDER — METHOCARBAMOL 500 MG PO TABS
500.0000 mg | ORAL_TABLET | Freq: Three times a day (TID) | ORAL | Status: DC | PRN
  Administered 2023-10-13: 500 mg via ORAL
  Filled 2023-10-13: qty 1

## 2023-10-13 MED ORDER — METHOCARBAMOL 500 MG PO TABS
500.0000 mg | ORAL_TABLET | Freq: Three times a day (TID) | ORAL | 0 refills | Status: AC | PRN
  Filled 2023-10-13: qty 15, 5d supply, fill #0

## 2023-10-13 NOTE — Discharge Planning (Signed)
 [facility] Patient Discharge Orders- 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    Reviewed by: MD:______ RN_______

## 2023-10-13 NOTE — Progress Notes (Signed)
 DISCHARGE NOTE HOME JOSPH NORFLEET to be discharged Home per MD order. Discussed prescriptions and follow up appointments with the patient. Prescriptions given to patient; medication list explained in detail. Patient verbalized understanding.  Skin clean, dry and intact without evidence of skin break down, no evidence of skin tears noted. IV catheter discontinued intact. Site without signs and symptoms of complications. Dressing and pressure applied. Pt denies pain at the site currently. No complaints noted.  Patient free of lines, drains, and wounds.   An After Visit Summary (AVS) was printed and given to the patient. Patient escorted via wheelchair, and discharged home via private auto.  Margarita Grizzle, RN

## 2023-10-13 NOTE — Progress Notes (Signed)
 Marc Neal is an 43 y.o. 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  He presented with similar complaints on 10/07/23.  Given a prescription for cipro. In the ED, Temp 98.2, Bp 149/97, HR 94, RR 17, SpO2 98%.  Labs notable for Hgb 11.8, K 5.3, Co2 17, BUN 102, Cr 12.45, Ca 9.6, alb 4.  UA cloudy, moderate blood, moderate leukocytes, many bacteria, + prot.    Assessment/Plan:  Flank pain with hematuria - likely had a ruptured cyst vs infection.  He was started on cipro 3/19.  Plan per primary  New ESRD - given worsening uremic symptoms lasted for 2 and half hours on Friday.  He is feeling much better with no uremic symptoms, ambulating comfortably with no shortness of breath.   -Given very high census for dialysis today, no absolute indication for dialysis today, significant improvement in symptomatology we will treat the patient and not the renal numbers.    -Already spoken to the patient and he will go to [facility] M[facility] to start 10/15/23.  We will write orders for lower efficiency blood flows of approximately 350 cc/min to decrease risk of dialysis disequilibrium.   -OK to discharge from renal standpoint  HTN - stable.  Continue with home meds. 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   Anemia of CKD stage V - Hgb stable. Hyperkalemia - no need to cont Lokelma   Subjective: Is feeling much better; denies any fever chills nausea vomiting.  Ambulating comfortably with no shortness of breath.   Chemistry and CBC: Creatinine, Ser  Date/Time Value Ref Range Status  10/12/2023 07:51 AM 12.50 (H) 0.61 - 1.24 mg/dL Final  40/98/1191 47:82 PM 12.45 (H) 0.61 - 1.24 mg/dL Final  95/62/1308 65:78 PM 10.05 (H) 0.61 - 1.24 mg/dL Final  46/96/2952 84:13 PM 5.09 (H) 0.61 - 1.24 mg/dL Final  24/40/1027 25:36 AM 5.56 (H) 0.61 - 1.24 mg/dL Final   64/40/3474 25:95 AM 3.54 (H) 0.61 - 1.24 mg/dL Final  63/87/5643 32:95 AM 3.63 (H) 0.61 - 1.24 mg/dL Final  18/84/1660 63:01 AM 1.90 (H) 0.61 - 1.24 mg/dL Final  60/04/9322 55:73 PM 2.20 (H) 0.61 - 1.24 mg/dL Final   Recent Labs  Lab 10/07/23 1850 10/11/23 1228 10/12/23 0751  NA 137 137 136  K 5.3* 5.3* 4.9  CL 108 104 105  CO2 15* 17* 15*  GLUCOSE 100* 104* 151*  BUN 87* 102* 111*  CREATININE 10.05* 12.45* 12.50*  CALCIUM 8.8* 9.6 9.0   Recent Labs  Lab 10/07/23 1850 10/11/23 1228 10/12/23 0751  WBC 9.8 8.3 8.0  NEUTROABS 8.3* 6.1  --   HGB 10.9* 11.8* 10.8*  HCT 34.0* 37.6* 32.2*  MCV 88.8 88.5 85.4  PLT 166 256 220   Liver Function Tests: Recent Labs  Lab 10/07/23 1850 10/11/23 1228  AST 13* 12*  ALT 12 10  ALKPHOS 47 45  BILITOT 0.7 0.7  PROT 7.8 9.0*  ALBUMIN 4.0 4.0   Recent Labs  Lab 10/07/23 1850 10/11/23 1228  LIPASE 27 34   No results for input(s): "AMMONIA" in the last 168 hours. Cardiac Enzymes: No results for input(s): "CKTOTAL", "CKMB", "CKMBINDEX", "TROPONINI" in the last 168 hours. Iron Studies: No results for input(s): "IRON", "TIBC", "TRANSFERRIN", "FERRITIN" in the last 72 hours. PT/INR: @LABRCNTIP (inr:5)  Xrays/Other  Studies: ) Results for orders placed or performed during the hospital encounter of 10/11/23 (from the past 48 hours)  CBC with Differential     Status: Abnormal   Collection Time: 10/11/23 12:28 PM  Result Value Ref Range   WBC 8.3 4.0 - 10.5 K/uL   RBC 4.25 4.22 - 5.81 MIL/uL   Hemoglobin 11.8 (L) 13.0 - 17.0 g/dL   HCT 96.0 (L) 45.4 - 09.8 %   MCV 88.5 80.0 - 100.0 fL   MCH 27.8 26.0 - 34.0 pg   MCHC 31.4 30.0 - 36.0 g/dL   RDW 11.9 14.7 - 82.9 %   Platelets 256 150 - 400 K/uL   nRBC 0.0 0.0 - 0.2 %   Neutrophils Relative % 73 %   Neutro Abs 6.1 1.7 - 7.7 K/uL   Lymphocytes Relative 16 %   Lymphs Abs 1.3 0.7 - 4.0 K/uL   Monocytes Relative 9 %   Monocytes Absolute 0.7 0.1 - 1.0 K/uL   Eosinophils  Relative 1 %   Eosinophils Absolute 0.1 0.0 - 0.5 K/uL   Basophils Relative 1 %   Basophils Absolute 0.0 0.0 - 0.1 K/uL   Immature Granulocytes 0 %   Abs Immature Granulocytes 0.02 0.00 - 0.07 K/uL    Comment: Performed at [facility], [address] [place], [place] [address]  Comprehensive metabolic panel     Status: Abnormal   Collection Time: 10/11/23 12:28 PM  Result Value Ref Range   Sodium 137 135 - 145 mmol/L   Potassium 5.3 (H) 3.5 - 5.1 mmol/L   Chloride 104 98 - 111 mmol/L   CO2 17 (L) 22 - 32 mmol/L   Glucose, Bld 104 (H) 70 - 99 mg/dL    Comment: Glucose reference range applies only to samples taken after fasting for at least 8 hours.   BUN 102 (H) 6 - 20 mg/dL   Creatinine, Ser 08.65 (H) 0.61 - 1.24 mg/dL   Calcium 9.6 8.9 - 78.4 mg/dL   Total Protein 9.0 (H) 6.5 - 8.1 g/dL   Albumin 4.0 3.5 - 5.0 g/dL   AST 12 (L) 15 - 41 U/L   ALT 10 0 - 44 U/L   Alkaline Phosphatase 45 38 - 126 U/L   Total Bilirubin 0.7 0.0 - 1.2 mg/dL   GFR, Estimated 5 (L) >60 mL/min    Comment: (NOTE) Calculated using the CKD-EPI Creatinine Equation (2021)    Anion gap 16 (H) 5 - 15    Comment: Performed at [facility], [address], [place], [place] [address]  Lipase, blood     Status: None   Collection Time: 10/11/23 12:28 PM  Result Value Ref Range   Lipase 34 11 - 51 U/L    Comment: Performed at [facility], [address], [place], [place] [address]  Urinalysis, Routine w reflex microscopic -Urine, Clean Catch     Status: Abnormal   Collection Time: 10/11/23 12:28 PM  Result Value Ref Range   Color, Urine AMBER (A) YELLOW    Comment: BIOCHEMICALS MAY BE AFFECTED BY COLOR   APPearance CLOUDY (A) CLEAR   Specific Gravity, Urine 1.010 1.005 - 1.030   pH 5.0 5.0 - 8.0   Glucose, UA NEGATIVE NEGATIVE mg/dL   Hgb urine dipstick MODERATE (A) NEGATIVE   Bilirubin Urine NEGATIVE NEGATIVE   Ketones, ur NEGATIVE NEGATIVE mg/dL   Protein, ur 324 (A) NEGATIVE mg/dL    Nitrite NEGATIVE NEGATIVE  Leukocytes,Ua MODERATE (A) NEGATIVE   RBC / HPF >50 0 - 5 RBC/hpf   WBC, UA >50 0 - 5 WBC/hpf   Bacteria, UA MANY (A) NONE SEEN   Squamous Epithelial / HPF 0-5 0 - 5 /HPF   WBC Clumps PRESENT    Mucus PRESENT    Non Squamous Epithelial 0-5 (A) NONE SEEN    Comment: Performed at [facility], [address], [place], [place] [address]  Urine Culture     Status: Abnormal   Collection Time: 10/11/23  4:20 PM   Specimen: Urine, Clean Catch  Result Value Ref Range   Specimen Description URINE, CLEAN CATCH    Special Requests NONE    Culture (A)     <10,000 COLONIES/mL INSIGNIFICANT GROWTH Performed at [facility], [address] [place], [place] [address]    Report Status 10/13/2023 FINAL   Hepatitis B surface antibody,quantitative     Status: None   Collection Time: 10/11/23  8:05 PM  Result Value Ref Range   Hep B S AB Quant (Post) 109.0 Immunity>10 mIU/mL    Comment: (NOTE)  Status of Immunity                     Anti-HBs Level  ------------------                     -------------- Inconsistent with Immunity                  0.0 - 10.0 Consistent with Immunity                         >10.0 Performed At: [facility] [address] [place], [place] [id number] Jolene Schimke MD WG:[id number]   Hepatitis B surface antigen     Status: None   Collection Time: 10/11/23  8:05 PM  Result Value Ref Range   Hepatitis B Surface Ag NON REACTIVE NON REACTIVE    Comment: Performed at [facility], [address] [place], [place] [address]  HIV Antibody (routine testing w rflx)     Status: None   Collection Time: 10/12/23  5:43 AM  Result Value Ref Range   HIV Screen 4th Generation wRfx Non Reactive Non Reactive    Comment: Performed at [facility], [address], [place], [place] [address]  CBC     Status: Abnormal   Collection Time: 10/12/23  7:51 AM  Result Value Ref Range   WBC 8.0 4.0 - 10.5 K/uL   RBC 3.77 (L)  4.22 - 5.81 MIL/uL   Hemoglobin 10.8 (L) 13.0 - 17.0 g/dL    Comment: REPEATED TO VERIFY RESULTS VERIFIED VIA RECOLLECT    HCT 32.2 (L) 39.0 - 52.0 %    Comment: RESULTS VERIFIED VIA RECOLLECT   MCV 85.4 80.0 - 100.0 fL   MCH 28.6 26.0 - 34.0 pg   MCHC 33.5 30.0 - 36.0 g/dL   RDW 84.1 32.4 - 40.1 %   Platelets 220 150 - 400 K/uL    Comment: RESULTS VERIFIED VIA RECOLLECT   nRBC 0.0 0.0 - 0.2 %    Comment: Performed at [facility], [address], [place], [place] [address]  Basic metabolic panel     Status: Abnormal   Collection Time: 10/12/23  7:51 AM  Result Value Ref Range   Sodium 136 135 - 145 mmol/L   Potassium 4.9  3.5 - 5.1 mmol/L   Chloride 105 98 - 111 mmol/L   CO2 15 (L) 22 - 32 mmol/L   Glucose, Bld 151 (H) 70 - 99 mg/dL    Comment: Glucose reference range applies only to samples taken after fasting for at least 8 hours.   BUN 111 (H) 6 - 20 mg/dL   Creatinine, Ser 46.96 (H) 0.61 - 1.24 mg/dL   Calcium 9.0 8.9 - 29.5 mg/dL   GFR, Estimated 5 (L) >60 mL/min    Comment: (NOTE) Calculated using the CKD-EPI Creatinine Equation (2021)    Anion gap 16 (H) 5 - 15    Comment: Performed at [facility], [address], [place], [place] [address]   No results found.  PMH:   Past Medical History:  Diagnosis Date   Gout    HTN (hypertension)    Obesity    Polycystic kidney     PSH:   Past Surgical History:  Procedure Laterality Date   AV FISTULA PLACEMENT Right    KNEE SURGERY     NO PAST SURGERIES      Allergies: No Known Allergies  Medications:   Prior to Admission medications   Medication Sig Start Date End Date Taking? Authorizing Provider  allopurinol (ZYLOPRIM) 100 MG tablet Take 100 mg by mouth daily.   Yes [provider]  amLODipine (NORVASC) 10 MG tablet Take 1 tablet (10 mg total) by mouth daily. 01/25/17  Yes Auburn Bilberry, MD  calcitRIOL (ROCALTROL) 0.25 MCG capsule Take 0.25 mcg by mouth 3 (three) times a week. Mondays,  Wednesdays, Fridays 11/29/20  Yes [provider]  cholecalciferol (VITAMIN D) 1000 units tablet Take 2,000 Units by mouth daily.   Yes [provider]  ciprofloxacin (CIPRO) 500 MG tablet Take 500 mg by mouth daily with breakfast. 10/09/23  Yes [provider]  ferrous sulfate 324 MG TBEC Take 324 mg by mouth daily with breakfast.   Yes [provider]  hydrALAZINE (APRESOLINE) 50 MG tablet Take 50 mg by mouth daily. 11/26/20  Yes [provider]  labetalol (NORMODYNE) 300 MG tablet Take 1 tablet (300 mg total) by mouth 2 (two) times daily. Patient taking differently: Take 300 mg by mouth daily. 01/24/17  Yes Auburn Bilberry, MD  omeprazole (PRILOSEC) 20 MG capsule Take 20 mg by mouth daily. 03/04/20  Yes [provider]  oxyCODONE (OXY IR/ROXICODONE) 5 MG immediate release tablet Take 5 mg by mouth every 6 (six) hours as needed for severe pain (pain score 7-10). 10/07/23  Yes [provider]  traZODone (DESYREL) 50 MG tablet Take 25 mg by mouth at bedtime as needed for sleep.   Yes [provider]  sodium zirconium cyclosilicate (LOKELMA) 10 g PACK packet Take 10 g by mouth 2 (two) times daily for 7 days. Patient not taking: Reported on 10/11/2023 10/07/23 10/14/23  Chinita Pester, FNP    Discontinued Meds:   Medications Discontinued During This Encounter  Medication Reason   lisinopril (PRINIVIL,ZESTRIL) 40 MG tablet Discontinued by provider   ondansetron (ZOFRAN) 4 MG tablet No longer needed (for PRN medications)   ondansetron (ZOFRAN) 4 MG tablet No longer needed (for PRN medications)   melatonin tablet 3 mg    pentafluoroprop-tetrafluoroeth (GEBAUERS) aerosol 1 Application Patient Transfer   lidocaine (PF) (XYLOCAINE) 1 % injection 5 mL Patient Transfer   lidocaine-prilocaine (EMLA) cream 1 Application Patient Transfer   feeding supplement (NEPRO CARB STEADY) liquid 237 mL Patient Transfer  heparin injection 1,000 Units  Patient Transfer   anticoagulant sodium citrate solution 5 mL Patient Transfer   alteplase (CATHFLO ACTIVASE) injection 2 mg Patient Transfer    Social History:  reports that he has never smoked. He has never used smokeless tobacco. He reports current alcohol use. He reports that he does not use drugs.  Family History:   Family History  Problem Relation Age of Onset   Obesity Other     Blood pressure (!) 140/91, pulse (!) 106, temperature 98.7 F (37.1 C), temperature source Oral, resp. rate 20, height 6\' 3"  (1.905 m), weight 126.8 kg, SpO2 99%. Physical Exam: General appearance: NCAT  Eyes: negative findings Resp: CTA b/l Cardio: RRR GI: soft, non-tender Extremities: extremities normal Access: right forearm AVF +T/B and well developed       Ethelene Hal, MD 10/13/2023, 11:25 AM

## 2023-10-13 NOTE — Discharge Summary (Signed)
 Physician Discharge Summary  HAYWOOD MEINDERS ZOX:[id number] DOB: [birth year] DOA: 10/11/2023  PCP: [facility]  Admit date: 10/11/2023 Discharge date: 10/13/2023  Admitted From: Home Discharge disposition: Home  Recommendations at discharge:  Complete the course of antibiotics with 3 more days of oral Keflex As needed Robaxin for muscle spasm Start outpatient dialysis on Monday 10/15/2023   Brief narrative: Marc Neal is a 43 y.o. 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  Patient has a fistula in placed in anticipation of dialysis 3 years ago.  4 days ago the patient woke up and felt terrible.  He had right flank pain and started urinating blood.  He also started cramping a lot and felt fatigued and winded even after short walks.  Also reported low-grade fever. Was seen by nephrology as an outpatient and started on oral ciprofloxacin 3 days ago for UTI.  Preparations were made to start him on dialysis starting 3/24.Marland Kitchen  He was told that if he got worse before an outpatient dialysis site could be found that he should come to the ER.   2 days later he continued to feel really poor, continued to have some frank bleeding with urination so he presented to the ED on 3/20.     In the ED, patient was afebrile, Labs with BUN 102, creatinine 12.45 Urinalysis showed cloudy urine with moderate blood, moderate leukocytes, many bacteria. Patient was started on IV Rocephin Nephrology was consulted  Subjective: Patient was seen and examined this morning at hemodialysis. Pleasant, middle-aged African-American male.  Lying on bed.  Not in distress.  Feels better except for some spasm in the back of the neck. Followed by nephrologist later this morning. Stable for discharge home today  Hospital course: Flank  pain with hematuria Likely due to a ruptured cyst versus infection. Given moderate leukocytes in the urinalysis, he was started on IV Rocephin. Urine culture with less than 10,000 CFU per mL bacterial growth. Will discharge on oral Keflex 250 mg daily for 3 days (renally dosed).  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 Outpatient dialysis initiation was planned but felt worsened came to the ED for uremic symptoms. Had first session of hemodialysis yesterday Feeling much better.  Ambulatory.  No shortness of breath Per nephrology, patient can follow-up to start outpatient dialysis on Monday, 10/15/2023.  Muscle spasm Patient complains of muscle spasm for last several days, worse after dialysis yesterday mostly in the back of his neck.  As needed Robaxin helped.  Prescription given to continue as needed.  Hypertension Controlled on amlodipine, hydralazine, labetalol  Anemia of ESRD Mild.  Stable. Recent Labs    10/07/23 1850 10/11/23 1228 10/12/23 0751  HGB 10.9* 11.8* 10.8*  MCV 88.8 88.5 85.4   Hyperkalemia Given Lokelma prior to dialysis.  No need to continue outpatient. Recent Labs  Lab 10/07/23 1850 10/11/23 1228 10/12/23 0751  K 5.3* 5.3* 4.9   Mobility: Encourage ambulation  Goals of care   Code Status: Full Code    Consultants: Nephrology Family Communication: Family not at bedside today.  Spoke to patient's wife on the phone  Diet:  Diet Order             Diet general           Diet renal with fluid restriction Fluid restriction:  1200 mL Fluid; Room service appropriate? Yes; Fluid consistency: Thin  Diet effective now                   Nutritional status:  Body mass index is 34.94 kg/m.       Wounds:  -    Discharge Exam:   Vitals:   10/12/23 1946 10/12/23 1949 10/13/23 0400 10/13/23 0700  BP: (!) 136/99 (!) 136/99 (!) 142/89 (!) 140/91  Pulse: 99  100 (!) 106  Resp: 19  20   Temp: 100.2 F (37.9 C)  98.8 F (37.1 C) 98.7 F (37.1  C)  TempSrc: Oral  Oral Oral  SpO2: 99%  97% 99%  Weight:      Height:        Body mass index is 34.94 kg/m.  General exam: Pleasant, young African-American male.  Not in pain Skin: No rashes, lesions or ulcers. HEENT: Atraumatic, normocephalic, no obvious bleeding Lungs: Clear to auscultation bilaterally CVS: S1, S2, no murmur,   GI/Abd: Soft, nontender, nondistended, bowel sound present,   CNS: Alert, awake, oriented x 3 Psychiatry: Mood appropriate Extremities: No pedal edema, no calf tenderness,   Follow ups:    Follow-up Information     [facility], [facility]. Go on 10/15/2023.   Why: Schedule is Monday, Wednesday, Friday with 11:55 chair time.  On Monday, please arrive at 10:45 am for paperwork prior to treatment. Contact information: [address] [place] [address] [phone]         [facility].   Contact information: [address] [place] [facility] [address] [place] [address] [phone]                 Discharge Instructions:   Discharge Instructions     Call MD for:  difficulty breathing, headache or visual disturbances   Complete by: As directed    Call MD for:  extreme fatigue   Complete by: As directed    Call MD for:  hives   Complete by: As directed    Call MD for:  persistant dizziness or light-headedness   Complete by: As directed    Call MD for:  persistant nausea and vomiting   Complete by: As directed    Call MD for:  severe uncontrolled pain   Complete by: As directed    Call MD for:  temperature >100.4   Complete by: As directed    Diet general   Complete by: As directed    Renal diet   Discharge instructions   Complete by: As directed    Recommendations at discharge:   Complete the course of antibiotics with 3 more days of oral Keflex  As needed Robaxin for muscle spasm  Start outpatient dialysis on Monday 10/15/2023  General discharge instructions: Follow with  Primary MD [facility] in 7 days  Please request your PCP  to go over your hospital tests, procedures, radiology results at the follow up. Please get your medicines reviewed and adjusted.  Your PCP may decide to repeat certain labs or tests as needed. Do not drive, operate heavy machinery, perform activities at heights, swimming or participation in water activities or provide baby sitting services if your were admitted for syncope or siezures until you have seen by Primary MD or a Neurologist and advised to do so again. [place] Controlled Substance Reporting System database was reviewed. Do not drive, operate heavy machinery, perform activities at heights, swim, participate in water activities  or provide baby-sitting services while on medications for pain, sleep and mood until your outpatient physician has reevaluated you and advised to do so again.  You are strongly recommended to comply with the dose, frequency and duration of prescribed medications. Activity: As tolerated with Full fall precautions use walker/cane & assistance as needed Avoid using any recreational substances like cigarette, tobacco, alcohol, or non-prescribed drug. If you experience worsening of your admission symptoms, develop shortness of breath, life threatening emergency, suicidal or homicidal thoughts you must seek medical attention immediately by calling 911 or calling your MD immediately  if symptoms less severe. You must read complete instructions/literature along with all the possible adverse reactions/side effects for all the medicines you take and that have been prescribed to you. Take any new medicine only after you have completely understood and accepted all the possible adverse reactions/side effects.  Wear Seat belts while driving. You were cared for by a hospitalist during your hospital stay. If you have any questions about your discharge medications or the care you received while you were in the  hospital after you are discharged, you can call the unit and ask to speak with the hospitalist or the covering physician. Once you are discharged, your primary care physician will handle any further medical issues. Please note that NO REFILLS for any discharge medications will be authorized once you are discharged, as it is imperative that you return to your primary care physician (or establish a relationship with a primary care physician if you do not have one).   Increase activity slowly   Complete by: As directed        Discharge Medications:   Allergies as of 10/13/2023   No Known Allergies      Medication List     STOP taking these medications    ciprofloxacin 500 MG tablet Commonly known as: CIPRO   Lokelma 10 g Pack packet Generic drug: sodium zirconium cyclosilicate       TAKE these medications    allopurinol 100 MG tablet Commonly known as: ZYLOPRIM Take 100 mg by mouth daily.   amLODipine 10 MG tablet Commonly known as: NORVASC Take 1 tablet (10 mg total) by mouth daily.   calcitRIOL 0.25 MCG capsule Commonly known as: ROCALTROL Take 0.25 mcg by mouth 3 (three) times a week. Mondays, Wednesdays, Fridays   cephALEXin 250 MG capsule Commonly known as: KEFLEX Take 1 capsule (250 mg total) by mouth daily at 6 (six) AM for 3 days.   cholecalciferol 1000 units tablet Commonly known as: VITAMIN D Take 2,000 Units by mouth daily.   ferrous sulfate 324 MG Tbec Take 324 mg by mouth daily with breakfast.   hydrALAZINE 50 MG tablet Commonly known as: APRESOLINE Take 50 mg by mouth daily.   labetalol 300 MG tablet Commonly known as: NORMODYNE Take 1 tablet (300 mg total) by mouth 2 (two) times daily. What changed: when to take this   methocarbamol 500 MG tablet Commonly known as: ROBAXIN Take 1 tablet (500 mg total) by mouth every 8 (eight) hours as needed for muscle spasms.   omeprazole 20 MG capsule Commonly known as: PRILOSEC Take 20 mg by mouth  daily.   oxyCODONE 5 MG immediate release tablet Commonly known as: Oxy IR/ROXICODONE Take 5 mg by mouth every 6 (six) hours as needed for severe pain (pain score 7-10).   traZODone 50 MG tablet Commonly known as: DESYREL Take 25 mg by mouth at bedtime as needed for sleep.  The results of significant diagnostics from this hospitalization (including imaging, microbiology, ancillary and laboratory) are listed below for reference.    Procedures and Diagnostic Studies:   No results found.   Labs:   Basic Metabolic Panel: Recent Labs  Lab 10/07/23 1850 10/11/23 1228 10/12/23 0751  NA 137 137 136  K 5.3* 5.3* 4.9  CL 108 104 105  CO2 15* 17* 15*  GLUCOSE 100* 104* 151*  BUN 87* 102* 111*  CREATININE 10.05* 12.45* 12.50*  CALCIUM 8.8* 9.6 9.0   GFR Estimated Creatinine Clearance: 11 mL/min (A) (by C-G formula based on SCr of 12.5 mg/dL (H)). Liver Function Tests: Recent Labs  Lab 10/07/23 1850 10/11/23 1228  AST 13* 12*  ALT 12 10  ALKPHOS 47 45  BILITOT 0.7 0.7  PROT 7.8 9.0*  ALBUMIN 4.0 4.0   Recent Labs  Lab 10/07/23 1850 10/11/23 1228  LIPASE 27 34   No results for input(s): "AMMONIA" in the last 168 hours. Coagulation profile No results for input(s): "INR", "PROTIME" in the last 168 hours.  CBC: Recent Labs  Lab 10/07/23 1850 10/11/23 1228 10/12/23 0751  WBC 9.8 8.3 8.0  NEUTROABS 8.3* 6.1  --   HGB 10.9* 11.8* 10.8*  HCT 34.0* 37.6* 32.2*  MCV 88.8 88.5 85.4  PLT 166 256 220   Cardiac Enzymes: No results for input(s): "CKTOTAL", "CKMB", "CKMBINDEX", "TROPONINI" in the last 168 hours. BNP: Invalid input(s): "POCBNP" CBG: No results for input(s): "GLUCAP" in the last 168 hours. D-Dimer No results for input(s): "DDIMER" in the last 72 hours. Hgb A1c No results for input(s): "HGBA1C" in the last 72 hours. Lipid Profile No results for input(s): "CHOL", "HDL", "LDLCALC", "TRIG", "CHOLHDL", "LDLDIRECT" in the last 72  hours. Thyroid function studies No results for input(s): "TSH", "T4TOTAL", "T3FREE", "THYROIDAB" in the last 72 hours.  Invalid input(s): "FREET3" Anemia work up No results for input(s): "VITAMINB12", "FOLATE", "FERRITIN", "TIBC", "IRON", "RETICCTPCT" in the last 72 hours. Microbiology Recent Results (from the past 240 hours)  Urine Culture     Status: Abnormal   Collection Time: 10/11/23  4:20 PM   Specimen: Urine, Clean Catch  Result Value Ref Range Status   Specimen Description URINE, CLEAN CATCH  Final   Special Requests NONE  Final   Culture (A)  Final    <10,000 COLONIES/mL INSIGNIFICANT GROWTH Performed at [facility], [address] [place], [place] [address]    Report Status 10/13/2023 FINAL  Final    Time coordinating discharge: 45 minutes  Signed: Jinger Middlesworth  [facility] 10/13/2023, 11:39 AM

## 2023-10-15 ENCOUNTER — Telehealth (HOSPITAL_COMMUNITY): Payer: Self-pay | Admitting: Nephrology

## 2023-10-15 NOTE — Progress Notes (Signed)
 Late Note Entry- October 15, 2023  Pt was d/c to home on Saturday. Contacted [facility] this morning to be advised of pt's d/c date and that pt should start today as planned.. Renal PA completed orders in Epic.   Olivia Canter Renal Navigator [phone]

## 2023-10-15 NOTE — Telephone Encounter (Signed)
 Transition of Care - Initial Contact from Inpatient Facility  Date of discharge: 10/13/22 Date of contact: 10/15/22 Method: Phone Spoke to: Patient  Patient contacted to discuss transition of care from recent inpatient hospitalization. Patient was admitted to [facility] from 3/20-3/22/25 with discharge diagnosis of new ESRD.  The discharge medication list was reviewed. Patient understands the changes and has no concerns. Wondering abotu getting something for nausea- > will Erx Zofran.  Patient will return to his/her outpatient HD unit on: Today -> there for 1st HD now.  No other concerns at this time.  Ozzie Hoyle, PA-C [employer] Pager [phone]

## 2023-11-13 ENCOUNTER — Other Ambulatory Visit: Payer: Self-pay

## 2023-11-13 ENCOUNTER — Ambulatory Visit (HOSPITAL_COMMUNITY)
Admission: RE | Admit: 2023-11-13 | Discharge: 2023-11-13 | Disposition: A | Discharge status: Home / Self Care | Attending: Nephrology | Admitting: Nephrology

## 2023-11-13 ENCOUNTER — Encounter (HOSPITAL_COMMUNITY)
Admission: RE | Disposition: A | Discharge status: Home / Self Care | Payer: Self-pay | Source: Home / Self Care | Attending: Nephrology

## 2023-11-13 ENCOUNTER — Encounter (HOSPITAL_COMMUNITY): Payer: Self-pay | Admitting: Nephrology

## 2023-11-13 DIAGNOSIS — N186 End stage renal disease: Secondary | ICD-10-CM | POA: Insufficient documentation

## 2023-11-13 DIAGNOSIS — Y832 Surgical operation with anastomosis, bypass or graft as the cause of abnormal reaction of the patient, or of later complication, without mention of misadventure at the time of the procedure: Secondary | ICD-10-CM | POA: Insufficient documentation

## 2023-11-13 DIAGNOSIS — N250 Renal osteodystrophy: Secondary | ICD-10-CM | POA: Insufficient documentation

## 2023-11-13 DIAGNOSIS — I120 Hypertensive chronic kidney disease with stage 5 chronic kidney disease or end stage renal disease: Secondary | ICD-10-CM | POA: Diagnosis not present

## 2023-11-13 DIAGNOSIS — T82590A Other mechanical complication of surgically created arteriovenous fistula, initial encounter: Secondary | ICD-10-CM | POA: Insufficient documentation

## 2023-11-13 DIAGNOSIS — Z992 Dependence on renal dialysis: Secondary | ICD-10-CM | POA: Diagnosis not present

## 2023-11-13 DIAGNOSIS — D631 Anemia in chronic kidney disease: Secondary | ICD-10-CM | POA: Insufficient documentation

## 2023-11-13 HISTORY — PX: A/V FISTULAGRAM: CATH118298

## 2023-11-13 SURGERY — A/V FISTULAGRAM
Anesthesia: LOCAL | Laterality: Right

## 2023-11-13 MED ORDER — LIDOCAINE HCL (PF) 1 % IJ SOLN
INTRAMUSCULAR | Status: AC
  Filled 2023-11-13: qty 30

## 2023-11-13 MED ORDER — LIDOCAINE HCL (PF) 1 % IJ SOLN
INTRAMUSCULAR | Status: DC | PRN
  Administered 2023-11-13: 2 mL via SUBCUTANEOUS

## 2023-11-13 MED ORDER — IODIXANOL 320 MG/ML IV SOLN
INTRAVENOUS | Status: DC | PRN
  Administered 2023-11-13: 8 mL via INTRAVENOUS

## 2023-11-13 MED ORDER — HEPARIN (PORCINE) IN NACL 1000-0.9 UT/500ML-% IV SOLN
INTRAVENOUS | Status: DC | PRN
  Administered 2023-11-13: 500 mL

## 2023-11-13 SURGICAL SUPPLY — 3 items
BAG SNAP BAND KOVER 36X36 (MISCELLANEOUS) ×2 IMPLANT
COVER DOME SNAP 22 D (MISCELLANEOUS) ×2 IMPLANT
TRAY PV CATH (CUSTOM PROCEDURE TRAY) ×2 IMPLANT

## 2023-11-13 NOTE — Op Note (Signed)
 Patient presents for concerns of difficult cannulation in his right Cimino which was placed elsewhere March 2022.   On the physical exam, the fistula is not pulsatile and there is a nice thrill at the inflow.  Summary:  1)      The patient had normal fistulogram; the body of the fistula has small collaterals along the inflow but is of good caliber and rapid flows through the cannulation zone.  Dual upper arm drainage.   2)      Axillary vein, centrals widely patent.   3)      This right Cimino remains amenable to future percutaneous intervention.  Description of procedure: The right arm was prepped and draped in the usual sterile fashion. The forearm Cimino fistula was cannulated ([address]) with an 18G Angiocath needle directed in an antegrade direction in venous limb of the fistula.  Contrast [phone]) injection via the end of the IV. The angiogram of the fistula (09811) showed a patent outflow body of the cephalic fistula, dual upper arm drainage, arch, axillary vein, centrals; there are small accessory veins along the inflow limb of the fistula.   The flows are rapid through the cannulation zone and there is no evidence of active extravasation; cannulation zone of the fistula is approximately 9 to 10 mm in diameter.     Hemostasis: Focal external pressure was placed at the cannulation site on removal of the IV.  Sedation: None  Contrast. 8 mL  Monitoring: Because of the patient's comorbid conditions and sedation during the procedure, continuous EKG monitoring and O2 saturation monitoring was performed throughout the procedure by the RN. There were no abnormal arrhythmias encountered.  Complications: None  Diagnoses: I87.1 Stricture of vein  N18.6 ESRD T82.858A Stricture of access  Procedure Coding:  36901 Cannulation and angiogram of fistula  Q9967 Contrast  Recommendations:  1. Continue to cannulate the fistula with 15G needles.  2. Refer for problems with flows/swelling. 3. Remove  the suture next treatment.   Discharge: The patient was discharged home in stable condition. The patient was given education regarding the care of the dialysis access AVF and specific instructions in case of any problems.

## 2023-11-13 NOTE — H&P (Addendum)
 Chief Complaint: Decreased flows  Interval H&P  The patient has presented today for an angiogram/ angioplasty.  Various methods of treatment have been discussed with the patient.  After consideration of risk, benefits and other options for treatment, the patient has consented to a angiogram/ angioplasty with  possible stent placement.   Risks of angiogram with potential angioplasty and stenting if needed.contrast reaction, extravasation/ bleeding, dissection, hypotension and death were explained to the patient.  The patient's history has been reviewed and the patient has been examined, no changes in status.  Stable for angiogram/angioplasty  I have reviewed the patient's chart and labs.  Questions were answered to the patient's satisfaction.  Assessment/Plan: ESRD dialyzing MWF regimen  Code cannulation and a right Cimino fistula placed in March 2022 - planning on angiogram with possibly angioplasty. Renal osteodystrophy - continue binders per home regimen. Anemia - managed with ESA's and IV iron at dialysis center. HTN - resume home regimen.   HPI: JERVON REAM is an 43 y.o. male with a history of polycystic kidney disease, hypertension, obesity, gout, ESRD currently dialyzing through a right Cimino fistula with history of infiltration x 2 initially in the forearm level and then later in the upper arm.   ROS Per HPI.  Chemistry and CBC: Creatinine, Ser  Date/Time Value Ref Range Status  10/12/2023 07:51 AM 12.50 (H) 0.61 - 1.24 mg/dL Final  19/14/7829 56:21 PM 12.45 (H) 0.61 - 1.24 mg/dL Final  30/86/5784 69:62 PM 10.05 (H) 0.61 - 1.24 mg/dL Final  95/28/4132 44:01 PM 5.09 (H) 0.61 - 1.24 mg/dL Final  02/72/5366 44:03 AM 5.56 (H) 0.61 - 1.24 mg/dL Final  47/42/5956 38:75 AM 3.54 (H) 0.61 - 1.24 mg/dL Final  64/33/2951 88:41 AM 3.63 (H) 0.61 - 1.24 mg/dL Final  66/12/3014 01:09 AM 1.90 (H) 0.61 - 1.24 mg/dL Final  32/35/5732 20:25 PM 2.20 (H) 0.61 - 1.24 mg/dL Final   No  results for input(s): "NA", "K", "CL", "CO2", "GLUCOSE", "BUN", "CREATININE", "CALCIUM", "PHOS" in the last 168 hours.  Invalid input(s): "ALB" No results for input(s): "WBC", "NEUTROABS", "HGB", "HCT", "MCV", "PLT" in the last 168 hours. Liver Function Tests: No results for input(s): "AST", "ALT", "ALKPHOS", "BILITOT", "PROT", "ALBUMIN" in the last 168 hours. No results for input(s): "LIPASE", "AMYLASE" in the last 168 hours. No results for input(s): "AMMONIA" in the last 168 hours. Cardiac Enzymes: No results for input(s): "CKTOTAL", "CKMB", "CKMBINDEX", "TROPONINI" in the last 168 hours. Iron Studies: No results for input(s): "IRON", "TIBC", "TRANSFERRIN", "FERRITIN" in the last 72 hours. PT/INR: @LABRCNTIP (inr:5)  Xrays/Other Studies: )No results found for this or any previous visit (from the past 48 hours). No results found.  PMH:   Past Medical History:  Diagnosis Date   Gout    HTN (hypertension)    Obesity    Polycystic kidney     PSH:   Past Surgical History:  Procedure Laterality Date   AV FISTULA PLACEMENT Right    KNEE SURGERY     NO PAST SURGERIES      Allergies: No Known Allergies  Medications:   Prior to Admission medications   Medication Sig Start Date End Date Taking? Authorizing Provider  allopurinol  (ZYLOPRIM ) 100 MG tablet Take 100 mg by mouth daily.   Yes [provider]  buPROPion (WELLBUTRIN) 75 MG tablet Take 75 mg by mouth daily as needed (Mood). 10/13/23  Yes [provider]  calcitRIOL  (ROCALTROL ) 0.25 MCG capsule Take 0.25 mcg by mouth 3 (three) times a week. Mondays,  Wednesdays, Fridays 11/29/20  Yes [provider]  ferrous sulfate 324 MG TBEC Take 324 mg by mouth daily with breakfast.   Yes [provider]  olopatadine (PATANOL) 0.1 % ophthalmic solution Place 1 drop into both eyes daily as needed for allergies.   Yes [provider]  omeprazole (PRILOSEC) 20 MG capsule Take 20 mg by mouth daily.  03/04/20  Yes [provider]  oxyCODONE  (OXY IR/ROXICODONE ) 5 MG immediate release tablet Take 5 mg by mouth every 6 (six) hours as needed for severe pain (pain score 7-10). 10/07/23  Yes [provider]  sildenafil (VIAGRA) 100 MG tablet Take 100 mg by mouth as needed for erectile dysfunction. 10/13/23  Yes [provider]  traZODone  (DESYREL ) 50 MG tablet Take 25 mg by mouth at bedtime as needed for sleep.   Yes [provider]  cholecalciferol (VITAMIN D) 1000 units tablet Take 1,000 Units by mouth every other day.   Yes [provider]  methocarbamol  (ROBAXIN ) 500 MG tablet Take 1 tablet (500 mg total) by mouth every 8 (eight) hours as needed for muscle spasms. Patient not taking: Reported on 11/12/2023 10/13/23   Hoyt Macleod, MD    Discontinued Meds:   Medications Discontinued During This Encounter  Medication Reason   amLODipine  (NORVASC ) 10 MG tablet Patient Preference   labetalol  (NORMODYNE ) 300 MG tablet Patient Preference   hydrALAZINE  (APRESOLINE ) 50 MG tablet Patient Preference    Social History:  reports that he has never smoked. He has never used smokeless tobacco. He reports current alcohol use. He reports that he does not use drugs.  Family History:   Family History  Problem Relation Age of Onset   Obesity Other     Blood pressure 118/77, pulse 91, temperature 98.2 F (36.8 C), resp. rate 12, SpO2 98%. GEN: NAD, A&Ox3, NCAT HEENT: No conjunctival pallor, EOMI NECK: Supple, no thyromegaly LUNGS: CTA B/L no rales, rhonchi or wheezing CV: RRR, No M/R/G ABD: SNDNT +BS  EXT: No lower extremity edema ACCESS: Cimino fistula, not hyper pulsatile, evidence of infiltration but not pulsatile over the hematoma        Patrick Boor, MD 11/13/2023, 8:49 AM

## 2023-11-13 NOTE — Discharge Instructions (Signed)

## 2023-12-11 ENCOUNTER — Encounter (INDEPENDENT_AMBULATORY_CARE_PROVIDER_SITE_OTHER): Payer: Self-pay

## 2024-01-31 ENCOUNTER — Other Ambulatory Visit: Payer: Self-pay

## 2024-01-31 ENCOUNTER — Ambulatory Visit (HOSPITAL_COMMUNITY)
Admission: RE | Admit: 2024-01-31 | Discharge: 2024-01-31 | Disposition: A | Discharge status: Home / Self Care | Attending: Surgery | Admitting: Surgery

## 2024-01-31 ENCOUNTER — Encounter (HOSPITAL_COMMUNITY)
Admission: RE | Disposition: A | Discharge status: Home / Self Care | Payer: Self-pay | Source: Home / Self Care | Attending: Surgery

## 2024-01-31 DIAGNOSIS — N186 End stage renal disease: Secondary | ICD-10-CM | POA: Diagnosis not present

## 2024-01-31 DIAGNOSIS — I120 Hypertensive chronic kidney disease with stage 5 chronic kidney disease or end stage renal disease: Secondary | ICD-10-CM | POA: Insufficient documentation

## 2024-01-31 DIAGNOSIS — Z992 Dependence on renal dialysis: Secondary | ICD-10-CM | POA: Diagnosis not present

## 2024-01-31 DIAGNOSIS — T82590A Other mechanical complication of surgically created arteriovenous fistula, initial encounter: Secondary | ICD-10-CM | POA: Diagnosis not present

## 2024-01-31 HISTORY — PX: A/V FISTULAGRAM: CATH118298

## 2024-01-31 SURGERY — A/V FISTULAGRAM
Anesthesia: LOCAL

## 2024-01-31 MED ORDER — IODIXANOL 320 MG/ML IV SOLN
INTRAVENOUS | Status: DC | PRN
  Administered 2024-01-31: 25 mL via INTRAVENOUS

## 2024-01-31 MED ORDER — LIDOCAINE HCL (PF) 1 % IJ SOLN
INTRAMUSCULAR | Status: AC
Start: 2024-01-31 — End: 2024-01-31
  Filled 2024-01-31: qty 30

## 2024-01-31 MED ORDER — HEPARIN (PORCINE) IN NACL 1000-0.9 UT/500ML-% IV SOLN
INTRAVENOUS | Status: DC | PRN
Start: 2024-01-31 — End: 2024-01-31
  Administered 2024-01-31: 500 mL

## 2024-01-31 MED ORDER — LIDOCAINE HCL (PF) 1 % IJ SOLN
INTRAMUSCULAR | Status: DC | PRN
Start: 2024-01-31 — End: 2024-01-31
  Administered 2024-01-31: 5 mL

## 2024-01-31 SURGICAL SUPPLY — 5 items
KIT MICROPUNCTURE NIT STIFF (SHEATH) ×1 IMPLANT
SHEATH PROBE COVER 6X72 (BAG) ×1 IMPLANT
TRAY PV CATH (CUSTOM PROCEDURE TRAY) ×2 IMPLANT
TUBING CIL FLEX 10 FLL-RA (TUBING) ×1 IMPLANT
WIRE TORQFLEX AUST .018X40CM (WIRE) ×1 IMPLANT

## 2024-01-31 NOTE — Op Note (Signed)
    Patient name: Marc Neal MRN: [id number] DOB: 1981-05-28 Sex: male  01/31/2024 Pre-operative Diagnosis: ESRD Post-operative diagnosis:  Same Surgeon:  Malvina New Procedure Performed:  1.  Ultrasound-guided access, right radiocephalic fistula  2.  Fistulogram [phone])   Indications: This is a 43 year old gentleman relatively new to dialysis who has been having issues with clearance rates.  He is here for fistulogram  Procedure:  The patient was identified in the holding area and taken to room 8.  The patient was then placed supine on the table and prepped and draped in the usual sterile fashion.  A time out was called.  Ultrasound was used to evaluate the fistula.  The vein was patent and compressible.  A digital ultrasound image was acquired.  The fistula was then accessed under ultrasound guidance using a micropuncture needle.  An 018 wire was then asvanced without resistance and a micropuncture sheath was placed.  Contrast injections were then performed through the sheath.  The sheath was removed and a Monocryl was used for closure.  Findings: No evidence of central venous stenosis.  The arteriovenous anastomosis is widely patent.  There are multiple branches within the cephalic vein in the upper arm however there are 2 prominent branches at the antecubital crease giving rise to the basilic system.    Impression:  #1  Widely patent right radiocephalic fistula  #2  There are 2 branches near the antecubital crease that are prominent.  One of them fills the basilic system.  I discussed proceeding with branch ligation of these 2 branches to help with clearance of his fistula.  There are multiple other branches that I will not address as they do not appear to be clinically significant.   ALONSO Malvina New, M.D., [facility] [place] Office: [phone] Pager:  [phone]

## 2024-01-31 NOTE — H&P (Signed)
 [facility] [place]  Patient name: Marc Neal MRN: [id number] DOB: 28-Feb-1981 Sex: male   REASON FOR VISIT:    Dialysis access   HISOTRY OF PRESENT ILLNESS:    Marc Neal is a 43 y.o. male with ESRD who has dialysis via a right wrist fistula.  He is having issues with clearance at dialysis and needs a fistulogram.  He had a normal study in April.  He is medicall managed for HTN   PAST MEDICAL HISTORY:   Past Medical History:  Diagnosis Date   Gout    HTN (hypertension)    Obesity    Polycystic kidney      FAMILY HISTORY:   Family History  Problem Relation Age of Onset   Obesity Other     SOCIAL HISTORY:   Social History   Tobacco Use   Smoking status: Never   Smokeless tobacco: Never  Substance Use Topics   Alcohol use: Yes     ALLERGIES:   No Known Allergies   CURRENT MEDICATIONS:   No current facility-administered medications for this encounter.    REVIEW OF SYSTEMS:   [X]  denotes positive finding, [ ]  denotes negative finding Cardiac  Comments:  Chest pain or chest pressure:    Shortness of breath upon exertion:    Short of breath when lying flat:    Irregular heart rhythm:        Vascular    Pain in calf, thigh, or hip brought on by ambulation:    Pain in feet at night that wakes you up from your sleep:     Blood clot in your veins:    Leg swelling:         Pulmonary    Oxygen at home:    Productive cough:     Wheezing:         Neurologic    Sudden weakness in arms or legs:     Sudden numbness in arms or legs:     Sudden onset of difficulty speaking or slurred speech:    Temporary loss of vision in one eye:     Problems with dizziness:         Gastrointestinal    Blood in stool:     Vomited blood:         Genitourinary    Burning when urinating:     Blood in urine:        Psychiatric    Major depression:         Hematologic    Bleeding problems:     Problems with blood clotting too easily:        Skin    Rashes or ulcers:        Constitutional    Fever or chills:      PHYSICAL EXAM:   Vitals:   01/31/24 0739 01/31/24 0744  BP: 120/85 120/85  Pulse: 74 75  Resp: 12 12  Temp: 98 F (36.7 C)   TempSrc: Oral   SpO2: 100% 97%    GENERAL: The patient is a well-nourished male, in no acute distress. The vital signs are documented above. CARDIAC: There is a regular rate and rhythm.  VASCULAR: palpable thrill in fistula PULMONARY: Non-labored respirations ABDOMEN: Soft and non-tender with normal pitched bowel sounds.  MUSCULOSKELETAL: There are no major deformities or cyanosis. NEUROLOGIC: No focal weakness or paresthesias are detected. SKIN: There are no ulcers or rashes noted. PSYCHIATRIC: The patient has a normal  affect.  STUDIES:     MEDICAL ISSUES:   ESRD:  discussed fistulogram and intervention as indicated.  All questions answered    Malvina Serene CLORE, MD, FACS [facility] [phone] Pager [phone]

## 2024-02-01 ENCOUNTER — Other Ambulatory Visit: Payer: Self-pay

## 2024-02-01 ENCOUNTER — Encounter (HOSPITAL_COMMUNITY): Payer: Self-pay | Admitting: Surgery

## 2024-02-01 DIAGNOSIS — N186 End stage renal disease: Secondary | ICD-10-CM

## 2024-02-27 ENCOUNTER — Encounter (HOSPITAL_COMMUNITY): Payer: Self-pay | Admitting: Vascular Surgery

## 2024-02-27 ENCOUNTER — Other Ambulatory Visit: Payer: Self-pay

## 2024-02-27 NOTE — Progress Notes (Signed)
 Anesthesia Chart Review: Marc Neal  Case: [id number] Date/Time: 02/28/24 0715   Procedure: LIGATION OF ARTERIOVENOUS  FISTULA (Right)   Anesthesia type: Choice   Diagnosis: ESRD (end stage renal disease) (HCC) [N18.6]   Pre-op diagnosis: ESRD   [facility] / [facility]   Surgeons: Pearline Norman RAMAN, MD       DISCUSSION: Patient is a 43 year old male scheduled for the above procedure. S/p right radiocephalic AVF. Per fistulogram report from 01/31/24, the AVF is widely patent with but 2 prominent branches, and ligation of completing branches is planned.    History includes never smoker, HTN, polycystic kidney disease, ESRD (right radiocephalic AVF 2022; HD imitated ~ 10/15/23), PTSD, OSA (consistent use of BiPAP). Prescribed latent TB treatment with rifampin x 4 months in 2023. Quantiferon Panel on 10/30/23 was Negative [facility]).   Per [facility] records he had a negative ETT in 08/2022 (see CV below).  Anesthesia team to evaluate on the day of surgery.   VS: Ht 6' 3 (1.905 m)   Wt 126.8 kg   BMI 34.94 kg/m  BP Readings from Last 3 Encounters:  01/31/24 133/83  11/13/23 120/76  10/13/23 119/71   Pulse Readings from Last 3 Encounters:  01/31/24 73  11/13/23 82  10/13/23 99     PROVIDERS: [facility]   LABS: For day of surgery. As of 10/12/23 H/H 10.8/32.2, glucose 151.    IMAGES: 1V CXR 10/07/23: FINDINGS: The heart size and mediastinal contours are within normal limits. Low lung volumes are noted with mild atelectasis seen within the right lung base. No pleural effusion or pneumothorax is identified. The visualized skeletal structures are unremarkable. IMPRESSION: Low lung volumes with mild right basilar atelectasis.  CT Abd/pelvis 10/07/23 ([facility]): Impression: No acute abdominopelvic process. Stable findings of polycystic  kidney disease.    EKG: 10/07/23: Sinus tachycardia at 106 bpm Anterior infarct , age undetermined Abnormal ECG When  compared with ECG of 05-Dec-2020 03:30, Premature ventricular complexes are no longer Present QRS axis Shifted left Nonspecific T wave abnormality now evident in Lateral leads Confirmed by UNCONFIRMED, DOCTOR ([id number]), editor Katheryn Males ([id number]) on 10/08/2023 11:55:35 AM   CV: Per [facility] Renal Transplant Evaluation Note from 10/18/23 by Lynelle Mountain: Had exercise stress test done in Feb 2024. Reviewed. Exercised for 6 mins and  achieved 10.1 METS. On betablocker. Achieved 75% MPHR with max HR of 136 beats  per min. Stress ECG was nondiagnostic for ischemia. No chest pain. Negative for  diagnostic ECG changes.  Nuclear Stress: (Nov 22) No evidence of reversible ischemia.  Echo (Apr 24) showed normal LVEF and grade II DD. No wall motion abnormalities  mentioned.   Per my note, 'Given his age, non-diabetic status, overall functional status, and  recent cardiac tests, no further cardiac work up needed at this point. Will  repeat another modality of heart testing in 1-2 yrs. If he develops any  cardiopulmonary symptoms, then will request cardiac stress test in one year.  Continue currrent status in [facility]'.     Past Medical History:  Diagnosis Date   Gout    HTN (hypertension)    Obesity    Polycystic kidney    PTSD (post-traumatic stress disorder)    per patient    Past Surgical History:  Procedure Laterality Date   A/V FISTULAGRAM Right 11/13/2023   Procedure: A/V Fistulagram;  Surgeon: Melia Lynwood ORN, MD;  Location: [facility] INVASIVE CV LAB;  Service: Cardiovascular;  Laterality: Right;  A/V FISTULAGRAM N/A 01/31/2024   Procedure: A/V Fistulagram;  Surgeon: Serene Gaile ORN, MD;  Location: [facility] PV LAB;  Service: Cardiovascular;  Laterality: N/A;   AV FISTULA PLACEMENT Right    KNEE SURGERY     NO PAST SURGERIES      MEDICATIONS: No current facility-administered medications for this encounter.    allopurinol  (ZYLOPRIM ) 100 MG tablet   AURYXIA 1 GM 210 MG(Fe) tablet    buPROPion (WELLBUTRIN) 75 MG tablet   calcitRIOL  (ROCALTROL ) 0.25 MCG capsule   omeprazole (PRILOSEC) 20 MG capsule   traZODone  (DESYREL ) 50 MG tablet   methocarbamol  (ROBAXIN ) 500 MG tablet   sildenafil (VIAGRA) 100 MG tablet    Marc Ruder, PA-C Surgical Short Stay/Anesthesiology [facility] Phone [phone] [facility] Phone [phone] 02/27/2024 10:22 AM

## 2024-02-27 NOTE — Progress Notes (Signed)
 SDW CALL  Patient was given pre-op instructions over the phone. The opportunity was given for the patient to ask questions. No further questions asked. Patient verbalized understanding of instructions given.  Date & Arrival Time-February 28, 2024 at 5:30 am.  PCP - [facility]  Cardiologist -   PPM/ICD - denies Device Orders - n/a Rep Notified - n/a  Chest x-ray - 10-07-23 EKG - 10-08-23 Stress Test - 08-2022 ([facility]) ECHO - 01-24-17 Cardiac Cath -   Sleep Study -  5+years ago per patient BiPAP per patient does not use it like he should   Dm denies  Blood Thinner Instructions:denies Aspirin Instructions:n/a  ERAS Protcol -NPO   COVID TEST- n/a   Anesthesia review: yes, H TN, ESRD   Patient denies shortness of breath, fever, cough and chest pain over the phone call   All instructions explained to the patient, with a verbal understanding of the material. Patient agrees to go over the instructions while at home for a better understanding.

## 2024-02-27 NOTE — Anesthesia Preprocedure Evaluation (Signed)
 Anesthesia Evaluation  Patient identified by MRN, date of birth, ID band Patient awake    Reviewed: Allergy & Precautions, NPO status , Patient's Chart, lab work & pertinent test results  Airway Mallampati: II  TM Distance: >3 FB Neck ROM: Full    Dental no notable dental hx.    Pulmonary sleep apnea    Pulmonary exam normal        Cardiovascular hypertension, Normal cardiovascular exam     Neuro/Psych  Headaches PSYCHIATRIC DISORDERS Anxiety Depression    PTSD (post-traumatic stress disorder)   GI/Hepatic negative GI ROS, Neg liver ROS,,,  Endo/Other  negative endocrine ROS    Renal/GU ESRF and DialysisRenal disease     Musculoskeletal  (+) Arthritis ,    Abdominal  (+) + obese  Peds  Hematology negative hematology ROS (+)   Anesthesia Other Findings ESRD  Reproductive/Obstetrics                              Anesthesia Physical Anesthesia Plan  ASA: 3  Anesthesia Plan: MAC   Post-op Pain Management:    Induction:   PONV Risk Score and Plan: 1 and Ondansetron , Dexamethasone, Propofol  infusion and Treatment may vary due to age or medical condition  Airway Management Planned: Simple Face Mask  Additional Equipment:   Intra-op Plan:   Post-operative Plan:   Informed Consent: I have reviewed the patients History and Physical, chart, labs and discussed the procedure including the risks, benefits and alternatives for the proposed anesthesia with the patient or authorized representative who has indicated his/her understanding and acceptance.     Dental advisory given  Plan Discussed with: CRNA  Anesthesia Plan Comments: (PAT note written 02/27/2024 by Randall Rampersad, PA-C.  )         Anesthesia Quick Evaluation

## 2024-02-28 ENCOUNTER — Encounter (HOSPITAL_COMMUNITY): Payer: Self-pay | Admitting: Vascular Surgery

## 2024-02-28 ENCOUNTER — Ambulatory Visit (HOSPITAL_COMMUNITY)
Admission: RE | Admit: 2024-02-28 | Discharge: 2024-02-28 | Disposition: A | Discharge status: Home / Self Care | Attending: Vascular Surgery | Admitting: Vascular Surgery

## 2024-02-28 ENCOUNTER — Other Ambulatory Visit (HOSPITAL_COMMUNITY): Payer: Self-pay

## 2024-02-28 ENCOUNTER — Ambulatory Visit (HOSPITAL_COMMUNITY): Admitting: Vascular Surgery

## 2024-02-28 ENCOUNTER — Encounter (HOSPITAL_COMMUNITY)
Admission: RE | Disposition: A | Discharge status: Home / Self Care | Payer: Self-pay | Source: Home / Self Care | Attending: Vascular Surgery

## 2024-02-28 ENCOUNTER — Other Ambulatory Visit: Payer: Self-pay

## 2024-02-28 DIAGNOSIS — N186 End stage renal disease: Secondary | ICD-10-CM

## 2024-02-28 DIAGNOSIS — I120 Hypertensive chronic kidney disease with stage 5 chronic kidney disease or end stage renal disease: Secondary | ICD-10-CM | POA: Diagnosis not present

## 2024-02-28 DIAGNOSIS — F4310 Post-traumatic stress disorder, unspecified: Secondary | ICD-10-CM | POA: Diagnosis not present

## 2024-02-28 DIAGNOSIS — T82590A Other mechanical complication of surgically created arteriovenous fistula, initial encounter: Secondary | ICD-10-CM | POA: Insufficient documentation

## 2024-02-28 DIAGNOSIS — Y832 Surgical operation with anastomosis, bypass or graft as the cause of abnormal reaction of the patient, or of later complication, without mention of misadventure at the time of the procedure: Secondary | ICD-10-CM | POA: Insufficient documentation

## 2024-02-28 DIAGNOSIS — F418 Other specified anxiety disorders: Secondary | ICD-10-CM | POA: Diagnosis not present

## 2024-02-28 DIAGNOSIS — Z992 Dependence on renal dialysis: Secondary | ICD-10-CM

## 2024-02-28 DIAGNOSIS — T82898A Other specified complication of vascular prosthetic devices, implants and grafts, initial encounter: Secondary | ICD-10-CM | POA: Diagnosis not present

## 2024-02-28 HISTORY — DX: Post-traumatic stress disorder, unspecified: F43.10

## 2024-02-28 HISTORY — PX: LIGATION OF ARTERIOVENOUS  FISTULA: SHX5948

## 2024-02-28 LAB — POCT I-STAT, CHEM 8
BUN: 38 mg/dL — ABNORMAL HIGH (ref 6–20)
Calcium, Ion: 1.05 mmol/L — ABNORMAL LOW (ref 1.15–1.40)
Chloride: 98 mmol/L (ref 98–111)
Creatinine, Ser: 9.8 mg/dL — ABNORMAL HIGH (ref 0.61–1.24)
Glucose, Bld: 101 mg/dL — ABNORMAL HIGH (ref 70–99)
HCT: 48 % (ref 39.0–52.0)
Hemoglobin: 16.3 g/dL (ref 13.0–17.0)
Potassium: 4.3 mmol/L (ref 3.5–5.1)
Sodium: 139 mmol/L (ref 135–145)
TCO2: 29 mmol/L (ref 22–32)

## 2024-02-28 SURGERY — LIGATION OF ARTERIOVENOUS  FISTULA
Anesthesia: Monitor Anesthesia Care | Site: Arm Lower | Laterality: Right

## 2024-02-28 MED ORDER — CHLORHEXIDINE GLUCONATE 4 % EX SOLN: 60.0000 mL | Freq: Once | CUTANEOUS | Status: DC

## 2024-02-28 MED ORDER — SODIUM CHLORIDE 0.9% FLUSH: 3.0000 mL | INTRAVENOUS | Status: DC | PRN

## 2024-02-28 MED ORDER — PROPOFOL 10 MG/ML IV BOLUS
INTRAVENOUS | Status: DC | PRN
  Administered 2024-02-28: 30 mg via INTRAVENOUS

## 2024-02-28 MED ORDER — CHLORHEXIDINE GLUCONATE 0.12 % MT SOLN
OROMUCOSAL | Status: AC
  Administered 2024-02-28: 15 mL via OROMUCOSAL
  Filled 2024-02-28: qty 15

## 2024-02-28 MED ORDER — CEFAZOLIN SODIUM-DEXTROSE 2-4 GM/100ML-% IV SOLN
INTRAVENOUS | Status: AC
  Filled 2024-02-28: qty 100

## 2024-02-28 MED ORDER — PROPOFOL 1000 MG/100ML IV EMUL
INTRAVENOUS | Status: AC
  Filled 2024-02-28: qty 400

## 2024-02-28 MED ORDER — SODIUM CHLORIDE 0.9 % IV SOLN: INTRAVENOUS | Status: DC | PRN

## 2024-02-28 MED ORDER — OXYCODONE-ACETAMINOPHEN 5-325 MG PO TABS
1.0000 | ORAL_TABLET | Freq: Four times a day (QID) | ORAL | 0 refills | Status: AC | PRN
  Filled 2024-02-28: qty 8, 2d supply, fill #0

## 2024-02-28 MED ORDER — PROPOFOL 500 MG/50ML IV EMUL
INTRAVENOUS | Status: DC | PRN
  Administered 2024-02-28: 75 ug/kg/min via INTRAVENOUS

## 2024-02-28 MED ORDER — FENTANYL CITRATE (PF) 100 MCG/2ML IJ SOLN: 25.0000 ug | INTRAMUSCULAR | Status: DC | PRN

## 2024-02-28 MED ORDER — CHLORHEXIDINE GLUCONATE 0.12 % MT SOLN: 15.0000 mL | Freq: Once | OROMUCOSAL | Status: AC

## 2024-02-28 MED ORDER — DEXMEDETOMIDINE HCL IN NACL 80 MCG/20ML IV SOLN
INTRAVENOUS | Status: DC | PRN
  Administered 2024-02-28 (×3): 4 ug via INTRAVENOUS

## 2024-02-28 MED ORDER — MIDAZOLAM HCL 2 MG/2ML IJ SOLN
INTRAMUSCULAR | Status: DC | PRN
  Administered 2024-02-28: 2 mg via INTRAVENOUS

## 2024-02-28 MED ORDER — CEFAZOLIN SODIUM-DEXTROSE 2-4 GM/100ML-% IV SOLN
2.0000 g | INTRAVENOUS | Status: AC
  Administered 2024-02-28: 2 g via INTRAVENOUS

## 2024-02-28 MED ORDER — LIDOCAINE 2% (20 MG/ML) 5 ML SYRINGE
INTRAMUSCULAR | Status: DC | PRN
  Administered 2024-02-28: 60 mg via INTRAVENOUS

## 2024-02-28 MED ORDER — HEPARIN 6000 UNIT IRRIGATION SOLUTION
Status: AC
  Filled 2024-02-28: qty 500

## 2024-02-28 MED ORDER — ORAL CARE MOUTH RINSE: 15.0000 mL | Freq: Once | OROMUCOSAL | Status: AC

## 2024-02-28 MED ORDER — PHENYLEPHRINE HCL-NACL 20-0.9 MG/250ML-% IV SOLN
INTRAVENOUS | Status: AC
  Filled 2024-02-28: qty 500

## 2024-02-28 MED ORDER — FENTANYL CITRATE (PF) 250 MCG/5ML IJ SOLN
INTRAMUSCULAR | Status: DC | PRN
  Administered 2024-02-28 (×2): 25 ug via INTRAVENOUS

## 2024-02-28 MED ORDER — 0.9 % SODIUM CHLORIDE (POUR BTL) OPTIME
TOPICAL | Status: DC | PRN
  Administered 2024-02-28: 1000 mL

## 2024-02-28 MED ORDER — LIDOCAINE-EPINEPHRINE (PF) 1 %-1:200000 IJ SOLN
INTRAMUSCULAR | Status: AC
Start: 2024-02-28 — End: 2024-02-28
  Filled 2024-02-28: qty 30

## 2024-02-28 MED ORDER — PHENYLEPHRINE HCL-NACL 20-0.9 MG/250ML-% IV SOLN
INTRAVENOUS | Status: DC | PRN
  Administered 2024-02-28: 20 ug/min via INTRAVENOUS

## 2024-02-28 MED ORDER — PROPOFOL 10 MG/ML IV BOLUS
INTRAVENOUS | Status: AC
  Filled 2024-02-28: qty 20

## 2024-02-28 MED ORDER — FENTANYL CITRATE (PF) 250 MCG/5ML IJ SOLN
INTRAMUSCULAR | Status: AC
Start: 2024-02-28 — End: 2024-02-28
  Filled 2024-02-28: qty 5

## 2024-02-28 MED ORDER — LIDOCAINE HCL (PF) 1 % IJ SOLN
INTRAMUSCULAR | Status: DC | PRN
  Administered 2024-02-28: 3 mL

## 2024-02-28 MED ORDER — LIDOCAINE HCL (PF) 1 % IJ SOLN
INTRAMUSCULAR | Status: AC
  Filled 2024-02-28: qty 30

## 2024-02-28 MED ORDER — MIDAZOLAM HCL 2 MG/2ML IJ SOLN
INTRAMUSCULAR | Status: AC
  Filled 2024-02-28: qty 2

## 2024-02-28 MED ORDER — PHENYLEPHRINE 80 MCG/ML (10ML) SYRINGE FOR IV PUSH (FOR BLOOD PRESSURE SUPPORT)
PREFILLED_SYRINGE | INTRAVENOUS | Status: DC | PRN
  Administered 2024-02-28 (×2): 160 ug via INTRAVENOUS

## 2024-02-28 MED ORDER — OXYCODONE HCL 5 MG PO TABS: 5.0000 mg | ORAL_TABLET | Freq: Once | ORAL | Status: DC | PRN

## 2024-02-28 MED ORDER — OXYCODONE HCL 5 MG/5ML PO SOLN: 5.0000 mg | Freq: Once | ORAL | Status: DC | PRN

## 2024-02-28 SURGICAL SUPPLY — 25 items
BAG COUNTER SPONGE SURGICOUNT (BAG) ×1 IMPLANT
CANISTER SUCTION 3000ML PPV (SUCTIONS) ×1 IMPLANT
CLIP TI MEDIUM 6 (CLIP) ×1 IMPLANT
CLIP TI WIDE RED SMALL 6 (CLIP) ×1 IMPLANT
COVER PROBE W GEL 5X96 (DRAPES) IMPLANT
DERMABOND ADVANCED .7 DNX12 (GAUZE/BANDAGES/DRESSINGS) ×1 IMPLANT
ELECTRODE REM PT RTRN 9FT ADLT (ELECTROSURGICAL) ×1 IMPLANT
GLOVE BIOGEL PI IND STRL 7.0 (GLOVE) ×1 IMPLANT
GOWN STRL REUS W/ TWL LRG LVL3 (GOWN DISPOSABLE) ×2 IMPLANT
GOWN STRL REUS W/ TWL XL LVL3 (GOWN DISPOSABLE) ×1 IMPLANT
KIT BASIN OR (CUSTOM PROCEDURE TRAY) ×1 IMPLANT
KIT TURNOVER KIT B (KITS) ×1 IMPLANT
NS IRRIG 1000ML POUR BTL (IV SOLUTION) ×1 IMPLANT
PACK CV ACCESS (CUSTOM PROCEDURE TRAY) ×1 IMPLANT
PAD ARMBOARD POSITIONER FOAM (MISCELLANEOUS) ×2 IMPLANT
POWDER SURGICEL 3.0 GRAM (HEMOSTASIS) IMPLANT
SUT ETHILON 3 0 PS 1 (SUTURE) IMPLANT
SUT MNCRL AB 4-0 PS2 18 (SUTURE) ×1 IMPLANT
SUT PROLENE 6 0 BV (SUTURE) IMPLANT
SUT SILK 0 TIES 10X30 (SUTURE) ×1 IMPLANT
SUT VIC AB 3-0 SH 27X BRD (SUTURE) ×1 IMPLANT
SYR CONTROL 10ML LL (SYRINGE) IMPLANT
TOWEL GREEN STERILE (TOWEL DISPOSABLE) ×1 IMPLANT
UNDERPAD 30X36 HEAVY ABSORB (UNDERPADS AND DIAPERS) ×1 IMPLANT
WATER STERILE IRR 1000ML POUR (IV SOLUTION) ×1 IMPLANT

## 2024-02-28 NOTE — Transfer of Care (Signed)
 Immediate Anesthesia Transfer of Care Note  Patient: Marc Neal  Procedure(s) Performed: RIGHT UPPER EXTREMITY ARTERIOVENOUS FISTULA BRANCH LIGATION (Right: Arm Lower)  Patient Location: PACU  Anesthesia Type:MAC  Level of Consciousness: awake, alert , and oriented  Airway & Oxygen Therapy: Patient Spontanous Breathing and Patient connected to face mask oxygen  Post-op Assessment: Report given to RN and Post -op Vital signs reviewed and stable  Post vital signs: Reviewed and stable  Last Vitals:  Vitals Value Taken Time  BP 126/86 02/28/24 08:30  Temp    Pulse 80 02/28/24 08:31  Resp 17 02/28/24 08:31  SpO2 96 % 02/28/24 08:31  Vitals shown include unfiled device data.  Last Pain:  Vitals:   02/28/24 0648  TempSrc:   PainSc: 0-No pain         Complications: No notable events documented.

## 2024-02-28 NOTE — Discharge Instructions (Signed)
   [facility]  Discharge Instructions  AV Fistula or Graft Surgery for Dialysis Access  Please refer to the following instructions for your post-procedure care. Your surgeon or physician assistant will discuss any changes with you.  Activity  You may drive the day following your surgery, if you are comfortable and no longer taking prescription pain medication. Resume full activity as the soreness in your incision resolves.  Bathing/Showering  You may shower after you go home. Keep your incision dry for 48 hours. Do not soak in a bathtub, hot tub, or swim until the incision heals completely. You may not shower if you have a hemodialysis catheter.  Incision Care  Clean your incision with mild soap and water after 48 hours. Pat the area dry with a clean towel. You do not need a bandage unless otherwise instructed. Do not apply any ointments or creams to your incision. You may have skin glue on your incision. Do not peel it off. It will come off on its own in about one week. Your arm may swell a bit after surgery. To reduce swelling use pillows to elevate your arm so it is above your heart. Your doctor will tell you if you need to lightly wrap your arm with an ACE bandage.  Diet  Resume your normal diet. There are not special food restrictions following this procedure. In order to heal from your surgery, it is CRITICAL to get adequate nutrition. Your body requires vitamins, minerals, and protein. Vegetables are the best source of vitamins and minerals. Vegetables also provide the perfect balance of protein. Processed food has little nutritional value, so try to avoid this.  Medications  Resume taking all of your medications. If your incision is causing pain, you may take over-the counter pain relievers such as acetaminophen  (Tylenol ). If you were prescribed a stronger pain medication, please be aware these medications can cause nausea and constipation. Prevent  nausea by taking the medication with a snack or meal. Avoid constipation by drinking plenty of fluids and eating foods with high amount of fiber, such as fruits, vegetables, and grains.  Do not take Tylenol  if you are taking prescription pain medications.  Follow up Your surgeon may want to see you in the office following your access surgery. If so, this will be arranged at the time of your surgery.  Please call us  immediately for any of the following conditions:  Increased pain, redness, drainage (pus) from your incision site Fever of 101 degrees or higher Severe or worsening pain at your incision site Hand pain or numbness.  Reduce your risk of vascular disease:  Stop smoking. If you would like help, call [facility] at 1-800-QUIT-NOW (959-829-0154) or [address] at [phone]  Manage your cholesterol Maintain a desired weight Control your diabetes Keep your blood pressure down  Dialysis  It will take several weeks to several months for your new dialysis access to be ready for use. Your surgeon will determine when it is okay to use it. Your nephrologist will continue to direct your dialysis. You can continue to use your Permcath until your new access is ready for use.   02/28/2024 Marc Neal [id number] 1981/01/23  Surgeon(s): Pearline Norman RAMAN, MD  Procedure(s): RIGHT UPPER EXTREMITY ARTERIOVENOUS FISTULA BRANCH LIGATION  x May stick fistula immediately Do not stick over incisions   If you have any questions, please call the office at [phone].

## 2024-02-28 NOTE — Op Note (Addendum)
    OPERATIVE NOTE  PROCEDURE:   Right arm AVF branch ligation  PRE-OPERATIVE DIAGNOSIS: ESRD  POST-OPERATIVE DIAGNOSIS: same as above   SURGEON: Norman GORMAN Serve MD  ASSISTANT(S): none  ANESTHESIA: IV sedation and local  ESTIMATED BLOOD LOSS: minimal  FINDING(S): 2 large branches identified, 1 more peripherally near the anastomosis tracking toward the dorsal aspect of the wrist and 1 near the [facility] tracking medially towards the basilic   SPECIMEN(S):  none  INDICATIONS:   Marc Neal is a 43 y.o. male with ESRD on HD.  He has been having intermittent issues with clearance during dialysis sessions.  He has been sent to the dialysis access center twice for fistulogram's.  No flow-limiting stenoses were noted but he was noted to have multiple branches that could be stealing flow and pressure from the main fistula.  Risks and benefits of branch ligation were reviewed, he expressed understanding and elected to proceed.    DESCRIPTION: The patient was brought to the operating room and positioned supine on the operating table.  IV sedation was administered and the right arm was prepped and draped in usual sterile fashion.  A timeout was performed and preoperative antibiotics were administered. I started by mapping of the entire fistula with ultrasound and marking large branches.  There appeared to be one peripherally near the anastomosis tracking to the dorsal aspect of the wrist and a another large branch near the [facility] tracking medially towards the basilic.  2 transverse incisions were made overlying these branches.  This was carried deeper with electrocautery until identifying the anterior aspect of the fistula.  Using sharp and blunt dissection I was able to circumferentially dissect the branches and these were ligated and transected between silk ties.  Both incisions were then copiously irrigated and inspected for hemostasis which was achieved with electrocautery.  Both incisions were then  closed in layers with 3-0 Vicryl and 4 Monocryl for the skin.  Dermabond was then applied. All counts were correct at the end of the procedure, he tolerated the procedure well and was brought to recovery in stable condition.    COMPLICATIONS: None apparent  CONDITION: Stable  Norman GORMAN Serve MD [facility] Phone Number: [phone] 02/28/2024 8:21 AM

## 2024-02-28 NOTE — H&P (Signed)
 Hospital H&P    MRN #:  [id number]  History of Present Illness: This is a 43 y.o. male presenting for AVF branch ligation. Has been having issues with clearance. Recently underwent fistulagram  Past Medical History:  Diagnosis Date   Gout    HTN (hypertension)    Obesity    Polycystic kidney    PTSD (post-traumatic stress disorder)    per patient    Past Surgical History:  Procedure Laterality Date   A/V FISTULAGRAM Right 11/13/2023   Procedure: A/V Fistulagram;  Surgeon: Melia Lynwood ORN, MD;  Location: [facility] INVASIVE CV LAB;  Service: Cardiovascular;  Laterality: Right;   A/V FISTULAGRAM N/A 01/31/2024   Procedure: A/V Fistulagram;  Surgeon: Serene Gaile ORN, MD;  Location: [facility] PV LAB;  Service: Cardiovascular;  Laterality: N/A;   AV FISTULA PLACEMENT Right    KNEE SURGERY     NO PAST SURGERIES      No Known Allergies  Prior to Admission medications   Medication Sig Start Date End Date Taking? Authorizing Provider  allopurinol  (ZYLOPRIM ) 100 MG tablet Take 100 mg by mouth daily.   Yes [provider]  AURYXIA 1 GM 210 MG(Fe) tablet Take 420 mg by mouth 3 (three) times daily with meals. 01/17/24 01/15/25 Yes [provider]  calcitRIOL  (ROCALTROL ) 0.25 MCG capsule Take 0.25 mcg by mouth every Monday, Wednesday, and Friday. 11/29/20  Yes [provider]  omeprazole (PRILOSEC) 20 MG capsule Take 20 mg by mouth daily. 03/04/20  Yes [provider]  traZODone  (DESYREL ) 50 MG tablet Take 25-50 mg by mouth at bedtime as needed for sleep.   Yes [provider]  buPROPion (WELLBUTRIN) 75 MG tablet Take 75 mg by mouth daily as needed (Mood). 10/13/23   [provider]  methocarbamol  (ROBAXIN ) 500 MG tablet Take 1 tablet (500 mg total) by mouth every 8 (eight) hours as needed for muscle spasms. Patient not taking: Reported on 11/12/2023 10/13/23   Arlice Reichert, MD  sildenafil (VIAGRA) 100 MG tablet Take 100 mg by mouth as needed for erectile  dysfunction. 10/13/23   [provider]    Social History   Socioeconomic History   Marital status: Married    Spouse name: Not on file   Number of children: Not on file   Years of education: Not on file   Highest education level: Not on file  Occupational History   Not on file  Tobacco Use   Smoking status: Never   Smokeless tobacco: Never  Substance and Sexual Activity   Alcohol use: Yes   Drug use: No   Sexual activity: Not on file  Other Topics Concern   Not on file  Social History Narrative   Not on file   Social Drivers of Health   Financial Resource Strain: Not on file  Food Insecurity: No Food Insecurity (10/11/2023)   Hunger Vital Sign    Worried About Running Out of Food in the Last Year: Never true    Ran Out of Food in the Last Year: Never true  Transportation Needs: No Transportation Needs (10/11/2023)   PRAPARE - Administrator, Civil Service (Medical): No    Lack of Transportation (Non-Medical): No  Physical Activity: Not on file  Stress: Not on file  Social Connections: Not on file  Intimate Partner Violence: Not At Risk (10/11/2023)   Humiliation, Afraid, Rape, and Kick questionnaire    Fear of Current or Ex-Partner: No    Emotionally Abused: No  Physically Abused: No    Sexually Abused: No    Family History  Problem Relation Age of Onset   Obesity Other     ROS: Otherwise negative unless mentioned in HPI  Physical Examination  Vitals:   02/28/24 0626  BP: 126/84  Pulse: 85  Resp: 16  Temp: 98.1 F (36.7 C)  SpO2: 100%   Body mass index is 33.78 kg/m.  General: no acute distress Cardiac: hemodynamically stable Pulm: normal work of breathing Neuro: alert, no focal deficit Extremities: palpable thrill in left arm RC AVF with multiple branches noted    Data:   Reviewed Brabham's fistulagram    ASSESSMENT/PLAN: This is a 43 y.o. male presenting for AVF branch ligation Reviewed risks and benefits and he  elected to proceed.    Norman GORMAN Serve MD [facility] [phone] 02/28/2024  7:19 AM

## 2024-02-28 NOTE — Anesthesia Postprocedure Evaluation (Signed)
 Anesthesia Post Note  Patient: Marc Neal  Procedure(s) Performed: RIGHT UPPER EXTREMITY ARTERIOVENOUS FISTULA BRANCH LIGATION (Right: Arm Lower)     Patient location during evaluation: PACU Anesthesia Type: MAC Level of consciousness: awake Pain management: pain level controlled Vital Signs Assessment: post-procedure vital signs reviewed and stable Respiratory status: spontaneous breathing, nonlabored ventilation and respiratory function stable Cardiovascular status: blood pressure returned to baseline and stable Postop Assessment: no apparent nausea or vomiting Anesthetic complications: no   No notable events documented.  Last Vitals:  Vitals:   02/28/24 0900 02/28/24 0901  BP:  108/82  Pulse: 76 75  Resp: 20 17  Temp:  36.6 C  SpO2: 96% 97%    Last Pain:  Vitals:   02/28/24 0900  TempSrc:   PainSc: 0-No pain                 [name] P Rishit Burkhalter

## 2024-02-29 ENCOUNTER — Encounter (HOSPITAL_COMMUNITY): Payer: Self-pay | Admitting: Vascular Surgery

## 2024-04-10 ENCOUNTER — Other Ambulatory Visit (HOSPITAL_COMMUNITY): Payer: Self-pay

## 2024-05-29 ENCOUNTER — Other Ambulatory Visit (INDEPENDENT_AMBULATORY_CARE_PROVIDER_SITE_OTHER): Payer: Self-pay | Admitting: Vascular Surgery

## 2024-05-29 DIAGNOSIS — N186 End stage renal disease: Secondary | ICD-10-CM

## 2024-05-30 ENCOUNTER — Encounter (INDEPENDENT_AMBULATORY_CARE_PROVIDER_SITE_OTHER): Payer: No Typology Code available for payment source

## 2024-05-30 ENCOUNTER — Ambulatory Visit (INDEPENDENT_AMBULATORY_CARE_PROVIDER_SITE_OTHER): Payer: No Typology Code available for payment source | Admitting: Vascular Surgery

## 2024-08-28 ENCOUNTER — Other Ambulatory Visit: Payer: Self-pay

## 2024-08-28 ENCOUNTER — Emergency Department (HOSPITAL_COMMUNITY)
Admission: EM | Admit: 2024-08-28 | Discharge: 2024-08-28 | Discharge status: Against Medical Advice | Attending: Emergency Medicine | Admitting: Emergency Medicine

## 2024-08-28 ENCOUNTER — Emergency Department (HOSPITAL_BASED_OUTPATIENT_CLINIC_OR_DEPARTMENT_OTHER)
Admission: EM | Admit: 2024-08-28 | Discharge: 2024-08-28 | Disposition: A | Discharge status: Home / Self Care | Source: Home / Self Care | Attending: Emergency Medicine | Admitting: Emergency Medicine

## 2024-08-28 DIAGNOSIS — W260XXA Contact with knife, initial encounter: Secondary | ICD-10-CM | POA: Insufficient documentation

## 2024-08-28 DIAGNOSIS — Z5321 Procedure and treatment not carried out due to patient leaving prior to being seen by health care provider: Secondary | ICD-10-CM | POA: Insufficient documentation

## 2024-08-28 DIAGNOSIS — S61412A Laceration without foreign body of left hand, initial encounter: Secondary | ICD-10-CM

## 2024-08-28 MED ORDER — LIDOCAINE-EPINEPHRINE (PF) 2 %-1:200000 IJ SOLN
10.0000 mL | Freq: Once | INTRAMUSCULAR | Status: AC
  Administered 2024-08-28: 10 mL
  Filled 2024-08-28: qty 20

## 2024-08-28 NOTE — ED Provider Notes (Signed)
 " [place] [facility] AT [facility] Provider Note   CSN: [id number] Arrival date & time: 08/28/24  2128     Patient presents with: Laceration   Marc Neal is a 44 y.o. male.   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  He was trying to get a plastic Off a bottle when the knife slipped and cut his hand.  Tetanus shot is up-to-date.  He denies any numbness or tingling of the finger.  The history is provided by the patient.  Laceration      Prior to Admission medications  Medication Sig Start Date End Date Taking? Authorizing Provider  allopurinol  (ZYLOPRIM ) 100 MG tablet Take 100 mg by mouth daily.    [provider]  AURYXIA 1 GM 210 MG(Fe) tablet Take 420 mg by mouth 3 (three) times daily with meals. 01/17/24 01/15/25  [provider]  buPROPion (WELLBUTRIN) 75 MG tablet Take 75 mg by mouth daily as needed (Mood). 10/13/23   [provider]  calcitRIOL  (ROCALTROL ) 0.25 MCG capsule Take 0.25 mcg by mouth every Monday, Wednesday, and Friday. 11/29/20   [provider]  methocarbamol  (ROBAXIN ) 500 MG tablet Take 1 tablet (500 mg total) by mouth every 8 (eight) hours as needed for muscle spasms. Patient not taking: Reported on 11/12/2023 10/13/23   Arlice Reichert, MD  omeprazole (PRILOSEC) 20 MG capsule Take 20 mg by mouth daily. 03/04/20   [provider]  oxyCODONE -acetaminophen  (PERCOCET) 5-325 MG tablet Take 1 tablet by mouth every 6 (six) hours as needed for severe pain (pain score 7-10). 02/28/24   Rhyne, Lucie PARAS, PA-C  sildenafil (VIAGRA) 100 MG tablet Take 100 mg by mouth as needed for erectile dysfunction. 10/13/23   [provider]  traZODone  (DESYREL ) 50 MG tablet Take 25-50 mg by mouth at bedtime as needed  for sleep.    [provider]    Allergies: Patient has no known allergies.    Review of Systems  Updated Vital Signs BP (!) 147/90 (BP Location: Right Leg)   Pulse 93   Temp 99.3 F (37.4 C) (Oral)   Resp 16   SpO2 99%   Physical Exam Vitals and nursing note reviewed.  Constitutional:      General: He is not in acute distress.    Appearance: He is well-developed.  HENT:     Head: Normocephalic and atraumatic.  Pulmonary:     Effort: Pulmonary effort is normal. No respiratory distress.  Musculoskeletal:        General: Tenderness and signs of injury present. Normal range of motion.       Hands:     Cervical back: Normal range of motion and neck supple.     Comments: 3 cm laceration to the thenar eminence of the left hand.  Normal flexion extension of the left thumb with normal sensation.  Skin:    General: Skin is warm and dry.     Capillary Refill: Capillary refill takes less than 2 seconds.     Findings: No erythema or rash.  Neurological:     Mental Status: He is alert and oriented to person, place, and time.  Psychiatric:        Behavior: Behavior normal.     (all labs ordered are listed, but only abnormal results are displayed)  Labs Reviewed - No data to display  EKG: None  Radiology: No results found.   Procedures  LACERATION REPAIR Performed by: [facility] Authorized by: Benton Shone Consent: Verbal consent obtained. Risks and benefits: risks, benefits and alternatives were discussed Consent given by: patient Patient identity confirmed: provided demographic data Prepped and Draped in normal sterile fashion Wound explored  Laceration Location: thenar eminence of the left hand  Laceration Length: 3cm  No Foreign Bodies seen or palpated  Anesthesia: local infiltration  Local anesthetic: lidocaine  2% with epinephrine   Anesthetic total: 4 ml  Irrigation method: syringe Amount of cleaning: standard  Skin closure: 5.0  vicryl rapide  Number of sutures: 5  Technique: simple interrupted  Patient tolerance: Patient tolerated the procedure well with no immediate complications.  Medications Ordered in the ED  lidocaine -EPINEPHrine  (XYLOCAINE  W/EPI) 2 %-1:200000 (PF) injection 10 mL (10 mLs Infiltration Given by Other 08/28/24 2157)                                    Medical Decision Making Risk Prescription drug management.   Patient presenting today after a cut to his left hand.  No evidence of any tendon or nerve injury.  Low suspicion for any foreign body.  Tetanus shot is up-to-date.  Wound repaired as above.     Final diagnoses:  Laceration of left palm without complication, initial encounter    ED Discharge Orders     None          Shone Benton, MD 08/28/24 2213  "

## 2024-08-28 NOTE — ED Triage Notes (Signed)
 POV, Laceration on left hand, pt was trying cut the tip of a caulking tube, but knife slip and cut his left hand by the base of the thumb, bleeding controlled. Pain 5 out of 10.   Does not remember last tetanus, however pt is on kidney transplant list.   Home hemodialysis patient 4 days a week.

## 2024-08-28 NOTE — ED Triage Notes (Signed)
 Pt reports trying to open a plastic bottle with a knife, and accidentally cut his left hand, appx 1.5 inch, bleeding controlled, area wrapped. TDAP unsure if UTD

## 2024-08-28 NOTE — Discharge Instructions (Signed)
 It is okay to wash your hands.  Do not soak your hand underwater.  Do not scrub directly on the stitches.  The stitches will dissolve so you should be able to pull them off in 7 to 10 days.  You can put a little Vaseline over the wound and cover it when you are doing anything that could contaminate the area.
